# Patient Record
Sex: Male | Born: 1970 | Race: White | Hispanic: No | Marital: Married | Smoking: Never smoker
Health system: Southern US, Community
[De-identification: ages and names within clinical notes are randomized; demographics above are authoritative.]

## PROBLEM LIST (undated history)

## (undated) DIAGNOSIS — E559 Vitamin D deficiency, unspecified: Secondary | ICD-10-CM

## (undated) DIAGNOSIS — Z9989 Dependence on other enabling machines and devices: Secondary | ICD-10-CM

## (undated) DIAGNOSIS — M1990 Unspecified osteoarthritis, unspecified site: Secondary | ICD-10-CM

## (undated) DIAGNOSIS — R0600 Dyspnea, unspecified: Secondary | ICD-10-CM

## (undated) DIAGNOSIS — I10 Essential (primary) hypertension: Secondary | ICD-10-CM

## (undated) DIAGNOSIS — E785 Hyperlipidemia, unspecified: Secondary | ICD-10-CM

## (undated) DIAGNOSIS — G4733 Obstructive sleep apnea (adult) (pediatric): Secondary | ICD-10-CM

## (undated) DIAGNOSIS — G43909 Migraine, unspecified, not intractable, without status migrainosus: Secondary | ICD-10-CM

## (undated) DIAGNOSIS — I2510 Atherosclerotic heart disease of native coronary artery without angina pectoris: Secondary | ICD-10-CM

## (undated) HISTORY — DX: Atherosclerotic heart disease of native coronary artery without angina pectoris: I25.10

## (undated) HISTORY — DX: Morbid (severe) obesity due to excess calories: E66.01

## (undated) HISTORY — PX: LIPOMA EXCISION: SHX5283

## (undated) HISTORY — DX: Dependence on other enabling machines and devices: Z99.89

## (undated) HISTORY — DX: Vitamin D deficiency, unspecified: E55.9

## (undated) HISTORY — DX: Obstructive sleep apnea (adult) (pediatric): G47.33

## (undated) HISTORY — PX: HERNIA REPAIR: SHX51

## (undated) HISTORY — DX: Essential (primary) hypertension: I10

## (undated) HISTORY — DX: Hyperlipidemia, unspecified: E78.5

---

## 2004-07-15 ENCOUNTER — Ambulatory Visit: Payer: Self-pay | Admitting: Gastroenterology

## 2004-08-09 ENCOUNTER — Ambulatory Visit: Payer: Self-pay | Admitting: Gastroenterology

## 2007-01-11 HISTORY — PX: UMBILICAL HERNIA REPAIR: SHX196

## 2007-04-25 ENCOUNTER — Ambulatory Visit: Payer: Self-pay | Admitting: Gastroenterology

## 2007-04-27 ENCOUNTER — Ambulatory Visit (HOSPITAL_COMMUNITY): Admission: RE | Admit: 2007-04-27 | Discharge: 2007-04-27 | Payer: Self-pay | Admitting: Gastroenterology

## 2007-05-04 ENCOUNTER — Ambulatory Visit: Payer: Self-pay | Admitting: Gastroenterology

## 2007-05-04 DIAGNOSIS — K432 Incisional hernia without obstruction or gangrene: Secondary | ICD-10-CM | POA: Insufficient documentation

## 2007-06-12 ENCOUNTER — Encounter: Payer: Self-pay | Admitting: Gastroenterology

## 2007-10-03 ENCOUNTER — Ambulatory Visit (HOSPITAL_COMMUNITY): Admission: RE | Admit: 2007-10-03 | Discharge: 2007-10-03 | Payer: Self-pay | Admitting: Surgery

## 2007-10-16 ENCOUNTER — Encounter: Payer: Self-pay | Admitting: Gastroenterology

## 2010-05-25 NOTE — Op Note (Signed)
NAMESHAQUON, GROPP              ACCOUNT NO.:  [id number]   MEDICAL RECORD NO.:  [id number]          PATIENT TYPE:  AMB   LOCATION:  DAY                          FACILITY:  [facility]   PHYSICIAN:  Wilmon Arms. Corliss Skains, M.D. DATE OF BIRTH:  1970-09-14   DATE OF PROCEDURE:  10/03/2007  DATE OF DISCHARGE:                               OPERATIVE REPORT   PREOPERATIVE DIAGNOSIS:  Supraumbilical ventral hernia.   POSTOPERATIVE DIAGNOSIS:  Supraumbilical ventral hernia.   PROCEDURE PERFORMED:  Open ventral hernia repair with mesh.   SURGEON:  Wilmon Arms. Tsuei, M.D.   ANESTHESIA:  General.   INDICATIONS:  The patient is a 40 year old male who was morbidly obese  who presents with a 4 month history of palpable bulge in his abdomen  above the umbilicus.  He initially felt this when he was coughing.  His  gastroenterologist obtained a CT scan which showed a ventral hernia  above the umbilicus with a 2 cm defect containing similar herniated  omental fat.  No small bowel was noted in the hernia.  The patient  presents now for repair of his hernia.   DESCRIPTION OF PROCEDURE:  The patient was brought to the operating room  and placed in the supine position on the operating room table.  After an  adequate level of general anesthesia was obtained, his abdomen was  shaved, prepped with Betadine and draped in sterile fashion.  A time-out  was taken to assure the proper patient and proper procedure.  A vertical  incision was made above the umbilicus extending about 8 cm.  Dissection  was carried down to the subcutaneous tissues with the cautery.  We  encountered a large hernia sac.  There was some adherent fat.  We  carefully dissected the hernia sac away from the surrounding tissues all  the way down to the fascia.  The patient is morbidly obese.  There was a  lot of subcutaneous adipose tissue and we actually had to use a Balfour  retractor to provide exposure.  Once we were finally able to expose  the  fascia it became apparent that there were actually two hernia defects.  He has a very small defect at his umbilicus.  The large defect measures  about 3 cm and was located above the umbilicus.  We connected both of  these into one large defect.  We reduced all of the hernia sac into the  preperitoneal space.  I cleared the underside of the fascia in all  directions.  We used a large Ventralex mesh and inserted this into the  preperitoneal space.  This was secured to the undersurface of the fascia  with a total of 10 interrupted transfascial zero Prolene sutures.  The  fascia was then reapproximated anterior to the mesh with a running #1  Prolene suture.  The subcutaneous tissues were irrigated and infiltrated  with quarter percent Marcaine  with epinephrine.  It was closed with a deep layer of 3-0 Vicryl and a  subcuticular 4-0 Monocryl.  Steri-Strips and clean dressings were  applied.  The patient was then extubated and then  brought to recovery in  stable condition.  All sponge, instrument and needle counts were  correct.      Wilmon Arms. Tsuei, M.D.  Electronically Signed     MKT/MEDQ  D:  10/03/2007  T:  10/04/2007  Job:  [id number]

## 2010-10-11 LAB — CBC
MCV: 87.2
Platelets: 338
WBC: 7.3

## 2010-10-11 LAB — DIFFERENTIAL
Eosinophils Absolute: 0.3
Lymphs Abs: 1.6
Neutro Abs: 4.8
Neutrophils Relative %: 66

## 2010-10-11 LAB — BASIC METABOLIC PANEL
BUN: 14
Calcium: 9.4
Chloride: 107
Creatinine, Ser: 0.89
GFR calc non Af Amer: >60

## 2012-03-14 ENCOUNTER — Ambulatory Visit (HOSPITAL_BASED_OUTPATIENT_CLINIC_OR_DEPARTMENT_OTHER): Payer: BC Managed Care – PPO | Attending: Family Medicine | Admitting: Radiology

## 2012-03-14 VITALS — Ht 70.0 in | Wt 370.0 lb

## 2012-03-14 DIAGNOSIS — G4733 Obstructive sleep apnea (adult) (pediatric): Secondary | ICD-10-CM | POA: Insufficient documentation

## 2012-03-24 DIAGNOSIS — R0609 Other forms of dyspnea: Secondary | ICD-10-CM

## 2012-03-24 DIAGNOSIS — R0989 Other specified symptoms and signs involving the circulatory and respiratory systems: Secondary | ICD-10-CM

## 2012-03-24 DIAGNOSIS — G4733 Obstructive sleep apnea (adult) (pediatric): Secondary | ICD-10-CM

## 2012-03-24 NOTE — Procedures (Signed)
[name], Michael Harrison              ACCOUNT NO.:  [id number]  MEDICAL RECORD NO.:  [id number]          PATIENT TYPE:  OUT  LOCATION:  SLEEP CENTER                 FACILITY:  [facility]  PHYSICIAN:  Serigne Kubicek D. Maple Hudson, MD, FCCP, FACPDATE OF BIRTH:  [birth year] 01[birth year] 1971  DATE OF STUDY:  03/14/2012                           NOCTURNAL POLYSOMNOGRAM  REFERRING PHYSICIAN:  Francis P. Modesto Charon, M.D.  INDICATION FOR STUDY:  Insomnia with sleep apnea.  EPWORTH SLEEPINESS SCORE:  12/24, BMI 53.1, weight 370 pounds, height 70 inches, neck 18.5 inches.  MEDICATIONS:  Home medications are charted as "none."  SLEEP ARCHITECTURE:  Total sleep time 246.5 minutes with sleep efficiency 65%.  Stage I was 11.8%, stage II 74.4%, stage III 5.7%, REM 8.1% of total sleep time.  Sleep latency 24.5 minutes, REM latency 265 minutes.  Awake after sleep onset 109.5 minutes.  Arousal index 56.2. Bedtime medication:  None.  RESPIRATORY DATA:  Apnea-hypopnea index (AHI) 85.2 per hour.  A total of 350 events were scored, including 176 obstructive apneas, 15 central apneas, 25 mixed apneas, 134 hypopneas.  Most events were associated with nonsupine sleep position.  REM AHI 102 per hour.  Because of delayed sleep onset, there was insufficient early sleep to meet protocol requirements for application of split protocol CPAP titration.  OXYGEN DATA:  Moderately loud snoring with oxygen desaturation to a nadir of 79% and mean oxygen saturation through the study of 91.4% on room air.  CARDIAC DATA:  Normal sinus rhythm.  MOVEMENT-PARASOMNIA:  No significant movement disturbance.  Bathroom x1.  IMPRESSIONS-RECOMMENDATIONS: 1. Sleep architecture was marked by some difficulty initiating and     maintaining sleep separate from respiratory events.  Sleep onset     was at midnight.  He woke spontaneously shortly after 1 a.m. and     was awake until nearly 3:00 a.m.  It may help to utilize a sedative     hypnotic as he begins to try to  correct his sleep problems. 2. Severe obstructive sleep apnea/hypopnea syndrome, AHI 85.2 per hour     with mostly non supine and REM associated events.  Moderately loud     snoring with oxygen desaturation to a nadir of 79% and mean oxygen     saturation through the study of 91.4% on room air. 3. Because of delayed sleep onset, there was insufficient early sleep     to meet protocol requirements for use of     split protocol CPAP titration on this study.  Recommend return for     dedicated CPAP titration or early consideration of alternatives as     appropriate.     Nona Gracey D. Maple Hudson, MD, [facility], FACP Diplomate, American Board of Sleep Medicine    CDY/MEDQ  D:  03/24/2012 09:44:04  T:  03/24/2012 10:10:27  Job:  [id number]

## 2012-03-26 ENCOUNTER — Ambulatory Visit (HOSPITAL_BASED_OUTPATIENT_CLINIC_OR_DEPARTMENT_OTHER): Payer: BC Managed Care – PPO

## 2012-03-27 ENCOUNTER — Telehealth: Payer: Self-pay | Admitting: Family Medicine

## 2012-03-27 ENCOUNTER — Telehealth: Payer: Self-pay

## 2012-03-27 ENCOUNTER — Other Ambulatory Visit: Payer: Self-pay

## 2012-03-27 DIAGNOSIS — G4733 Obstructive sleep apnea (adult) (pediatric): Secondary | ICD-10-CM

## 2012-03-27 NOTE — Telephone Encounter (Signed)
Almira Coaster Results in computer under [facility]  Dr Modesto Charon needs to review and call patient with results

## 2012-03-27 NOTE — Telephone Encounter (Signed)
Patient has severe obstructive sleep apnea and requires CPAP titration study. Check with patient whether or not Dr. Maple Hudson had inform him of such need for further testing with CPAP titration study. If not please expedite CPAP titration study

## 2012-03-27 NOTE — Telephone Encounter (Signed)
     Patient has severe obstructive sleep apnea and requires CPAP titration study.  Check with patient whether or not Dr. Maple Hudson had inform him of such need for further testing with CPAP titration study.  If not please expedite CPAP titration study       Spoke with wife  Would like this week or the first week of April. Going out of town next week. Referral sent to [facility]

## 2012-03-27 NOTE — Telephone Encounter (Signed)
PTS WIFE CALLED AND WANTS RESULTS OF SLEEP STUDY DONE TWO WEEKS AGO

## 2012-03-31 ENCOUNTER — Ambulatory Visit (INDEPENDENT_AMBULATORY_CARE_PROVIDER_SITE_OTHER): Payer: BC Managed Care – PPO | Admitting: Family Medicine

## 2012-03-31 VITALS — BP 122/82 | HR 87 | Temp 98.3°F | Ht 70.0 in | Wt 379.0 lb

## 2012-03-31 DIAGNOSIS — S61409A Unspecified open wound of unspecified hand, initial encounter: Secondary | ICD-10-CM

## 2012-03-31 DIAGNOSIS — S61451A Open bite of right hand, initial encounter: Secondary | ICD-10-CM

## 2012-03-31 DIAGNOSIS — J45909 Unspecified asthma, uncomplicated: Secondary | ICD-10-CM

## 2012-03-31 DIAGNOSIS — S61452A Open bite of left hand, initial encounter: Secondary | ICD-10-CM

## 2012-03-31 DIAGNOSIS — T148 Other injury of unspecified body region: Secondary | ICD-10-CM

## 2012-03-31 MED ORDER — TETANUS-DIPHTH-ACELL PERTUSSIS 5-2.5-18.5 LF-MCG/0.5 IM SUSP
0.5000 mL | Freq: Once | INTRAMUSCULAR | Status: AC
  Administered 2012-03-31: 0.5 mL via INTRAMUSCULAR

## 2012-03-31 MED ORDER — ALBUTEROL SULFATE HFA 108 (90 BASE) MCG/ACT IN AERS: 2.0000 | INHALATION_SPRAY | Freq: Four times a day (QID) | RESPIRATORY_TRACT | Status: DC | PRN

## 2012-03-31 MED ORDER — AMOXICILLIN-POT CLAVULANATE 875-125 MG PO TABS: 1.0000 | ORAL_TABLET | Freq: Two times a day (BID) | ORAL | Status: DC

## 2012-03-31 NOTE — Progress Notes (Signed)
Subjective:     Patient ID: Michael Harrison, male   DOB: January 14, 1970, 42 y.o.   MRN: [id number]  HPI This patient's mother recently passed away. He obtained her cat and was going to give it to someone else to care for. When he picked up a cat the cat got scared and bit him on both hands and scratched him in several places. Supposed to have its shots but its in quarantine at present today. He came to the wound checked it just happened several hours ago. He does remember his last tetanus shot. Past Medical History  Diagnosis Date  . Hypertension   . Hyperlipidemia   . Vitamin D deficiency   . Morbid obesity    Past Surgical History  Procedure Laterality Date  . Hernia repair     History   Social History  . Marital Status: Married    Spouse Name: N/A    Number of Children: N/A  . Years of Education: N/A   Occupational History  . Not on file.   Social History Main Topics  . Smoking status: Never Smoker   . Smokeless tobacco: Not on file  . Alcohol Use: Not on file  . Drug Use: Not on file  . Sexually Active: Not on file   Other Topics Concern  . Not on file   Social History Narrative  . No narrative on file   Family History  Problem Relation Age of Onset  . Diabetes Mother   . Heart disease Mother    No current outpatient prescriptions on file prior to visit.   No current facility-administered medications on file prior to visit.   Allergies  Allergen Reactions  . Iohexol      Desc: Notes indicated PT needs 13 hr prep    Immunization History  Administered Date(s) Administered  . Tdap 03/31/2012   Prior to Admission medications   Medication Sig Start Date End Date Taking? Authorizing Provider  fluticasone (FLONASE) 50 MCG/ACT nasal spray Place 2 sprays into the nose daily.   Yes Historical Provider, MD  albuterol (PROVENTIL HFA;VENTOLIN HFA) 108 (90 BASE) MCG/ACT inhaler Inhale 2 puffs into the lungs every 6 (six) hours as needed for wheezing. 03/31/12   Ileana Ladd, MD  amoxicillin-clavulanate (AUGMENTIN) 875-125 MG per tablet Take 1 tablet by mouth 2 (two) times daily. 03/31/12   Ileana Ladd, MD    Review of Systems    he also has a upper respiratory tract infection and it has caused him to have a bit of a bronchitis and a flareup of his asthma he does need a refill of the albuterol inhaler otherwise is not having any other major problems Objective:   Physical Exam On examination he appeared in no acute distress. Obese Vital signs as documented. BP 122/82  Pulse 87  Temp(Src) 98.3 F (36.8 C) (Oral)  Ht 5\' 10"  (1.778 m)  Wt 379 lb (171.913 kg)  BMI 54.38 kg/m2  Skin warm and dry and without overt rashes. He has most multiple scratch marks on his wrists and hands and several tiny puncture marks in various areas of the of his hands and on the fat pad of the thumbs Head &Neck without JVD. Normal. Lungs he has scattered rhonchi and end expiratory wheezes is not in any distress  Heart  exam notable for regular rhythm, normal sounds and absence of murmurs, rubs or gallops.  Abdomen unremarkable and without evidence of organomegaly, masses, or abdominal aortic enlargement.  Extremities nonedematous.  Assessment:     Animal bite of hand, right, initial encounter - Plan: TDaP (BOOSTRIX) injection 0.5 mL, amoxicillin-clavulanate (AUGMENTIN) 875-125 MG per tablet  Animal bite of hand, left, initial encounter - Plan: TDaP (BOOSTRIX) injection 0.5 mL, amoxicillin-clavulanate (AUGMENTIN) 875-125 MG per tablet  Asthmatic bronchitis - Plan: albuterol (PROVENTIL HFA;VENTOLIN HFA) 108 (90 BASE) MCG/ACT inhaler      Plan:       Patient had his wounds cleaned with Betadine and peroxide and dressed here in the clinic. Was given a tetanus booster.  He was placed on antibiotics Augmentin 875 mg by mouth ordered in meds/ orders. Wound check in 3 days however patient is not in town he travels for work. If it gets worse he will proceed to urgent care  or emergency room to get evaluated. Return to clinic when necessary.Thelma Barge P. Modesto Charon, M.D.

## 2012-04-01 ENCOUNTER — Encounter (HOSPITAL_COMMUNITY): Payer: Self-pay | Admitting: *Deleted

## 2012-04-01 ENCOUNTER — Ambulatory Visit (INDEPENDENT_AMBULATORY_CARE_PROVIDER_SITE_OTHER): Payer: BC Managed Care – PPO | Admitting: Family Medicine

## 2012-04-01 ENCOUNTER — Emergency Department (HOSPITAL_COMMUNITY)
Admission: EM | Admit: 2012-04-01 | Discharge: 2012-04-01 | Disposition: A | Discharge status: Home / Self Care | Payer: BC Managed Care – PPO | Attending: Emergency Medicine | Admitting: Emergency Medicine

## 2012-04-01 ENCOUNTER — Emergency Department (HOSPITAL_COMMUNITY): Payer: BC Managed Care – PPO

## 2012-04-01 VITALS — BP 142/92 | HR 80 | Temp 97.9°F | Resp 20 | Ht 68.5 in | Wt 379.2 lb

## 2012-04-01 DIAGNOSIS — W5501XA Bitten by cat, initial encounter: Secondary | ICD-10-CM

## 2012-04-01 DIAGNOSIS — L0291 Cutaneous abscess, unspecified: Secondary | ICD-10-CM

## 2012-04-01 DIAGNOSIS — Z8639 Personal history of other endocrine, nutritional and metabolic disease: Secondary | ICD-10-CM | POA: Insufficient documentation

## 2012-04-01 DIAGNOSIS — S61209A Unspecified open wound of unspecified finger without damage to nail, initial encounter: Secondary | ICD-10-CM | POA: Insufficient documentation

## 2012-04-01 DIAGNOSIS — I10 Essential (primary) hypertension: Secondary | ICD-10-CM | POA: Insufficient documentation

## 2012-04-01 DIAGNOSIS — L089 Local infection of the skin and subcutaneous tissue, unspecified: Secondary | ICD-10-CM

## 2012-04-01 DIAGNOSIS — IMO0002 Reserved for concepts with insufficient information to code with codable children: Secondary | ICD-10-CM | POA: Insufficient documentation

## 2012-04-01 DIAGNOSIS — IMO0001 Reserved for inherently not codable concepts without codable children: Secondary | ICD-10-CM | POA: Insufficient documentation

## 2012-04-01 DIAGNOSIS — Z792 Long term (current) use of antibiotics: Secondary | ICD-10-CM | POA: Insufficient documentation

## 2012-04-01 DIAGNOSIS — T148XXA Other injury of unspecified body region, initial encounter: Secondary | ICD-10-CM

## 2012-04-01 DIAGNOSIS — Z79899 Other long term (current) drug therapy: Secondary | ICD-10-CM | POA: Insufficient documentation

## 2012-04-01 DIAGNOSIS — Y939 Activity, unspecified: Secondary | ICD-10-CM | POA: Insufficient documentation

## 2012-04-01 DIAGNOSIS — Y929 Unspecified place or not applicable: Secondary | ICD-10-CM | POA: Insufficient documentation

## 2012-04-01 DIAGNOSIS — L0390 Cellulitis, unspecified: Secondary | ICD-10-CM

## 2012-04-01 DIAGNOSIS — Z862 Personal history of diseases of the blood and blood-forming organs and certain disorders involving the immune mechanism: Secondary | ICD-10-CM | POA: Insufficient documentation

## 2012-04-01 LAB — CBC WITH DIFFERENTIAL/PLATELET
Eosinophils Relative: 9 % — ABNORMAL HIGH (ref 0–5)
Lymphocytes Relative: 15 % (ref 12–46)
Lymphs Abs: 1.3 10*3/uL (ref 0.7–4.0)
MCV: 84.2 fL (ref 78.0–100.0)
Neutrophils Relative %: 67 % (ref 43–77)
Platelets: 349 10*3/uL (ref 150–400)
RBC: 4.93 MIL/uL (ref 4.22–5.81)
WBC: 8.4 10*3/uL (ref 4.0–10.5)

## 2012-04-01 LAB — BASIC METABOLIC PANEL
BUN: 15 mg/dL (ref 6–23)
CO2: 24 mEq/L (ref 19–32)
Calcium: 8.8 mg/dL (ref 8.4–10.5)
Chloride: 102 mEq/L (ref 96–112)
Creatinine, Ser: 0.78 mg/dL (ref 0.50–1.35)
GFR calc Af Amer: >90 mL/min (ref >90)
GFR calc non Af Amer: >90 mL/min (ref >90)
Glucose, Bld: 88 mg/dL (ref 70–99)
Potassium: 4 mEq/L (ref 3.5–5.1)
Sodium: 137 mEq/L (ref 135–145)

## 2012-04-01 MED ORDER — ONDANSETRON HCL 4 MG/2ML IJ SOLN
4.0000 mg | Freq: Once | INTRAMUSCULAR | Status: DC
  Filled 2012-04-01: qty 2

## 2012-04-01 MED ORDER — CLINDAMYCIN PHOSPHATE 600 MG/50ML IV SOLN
600.0000 mg | Freq: Once | INTRAVENOUS | Status: AC
  Administered 2012-04-01: 600 mg via INTRAVENOUS
  Filled 2012-04-01: qty 50

## 2012-04-01 MED ORDER — CLINDAMYCIN HCL 150 MG PO CAPS: 300.0000 mg | ORAL_CAPSULE | Freq: Two times a day (BID) | ORAL | Status: DC

## 2012-04-01 NOTE — ED Provider Notes (Signed)
History    This chart was scribed for non-physician practitioner working with Richardean Canal, MD by Donne Anon, ED Scribe. This patient was seen in room WA19/WA19 and the patient's care was started at 1534.   CSN: [id number]  Arrival date & time 04/01/12  1514   First MD Initiated Contact with Patient 04/01/12 1534      Chief Complaint  Patient presents with  . Animal Bite     The history is provided by the patient. No language interpreter was used.   Michael Harrison is a 42 y.o. male who presents to the Emergency Department complaining of sudden onset, constant, gradually worsening left hand pain due to a cat bite which occurred yesterday morning. He states the cat was a stray and is currently in quarantine. He states he saw Dr. Broadus John yesterday, who placed him on Augumentin (he took 2 doses yesterday, 1 today) and cleaned it. He reports associated soreness to palpation in his arm. He denies soreness when he moves his joints, fever, vomiting, chills, or any other pain.  Past Medical History  Diagnosis Date  . Hypertension   . Hyperlipidemia   . Vitamin D deficiency   . Morbid obesity     Past Surgical History  Procedure Laterality Date  . Hernia repair      Family History  Problem Relation Age of Onset  . Diabetes Mother   . Heart disease Mother     History  Substance Use Topics  . Smoking status: Never Smoker   . Smokeless tobacco: Not on file  . Alcohol Use: Not on file      Review of Systems  Constitutional: Negative for fever and chills.  Gastrointestinal: Negative for vomiting.  Musculoskeletal: Negative for arthralgias.  Skin: Positive for wound.  All other systems reviewed and are negative.    Allergies  Iohexol  Home Medications   Current Outpatient Rx  Name  Route  Sig  Dispense  Refill  . albuterol (PROVENTIL HFA;VENTOLIN HFA) 108 (90 BASE) MCG/ACT inhaler   Inhalation   Inhale 2 puffs into the lungs every 6 (six) hours as needed for  wheezing.   1 Inhaler   0   . amoxicillin-clavulanate (AUGMENTIN) 875-125 MG per tablet   Oral   Take 1 tablet by mouth 2 (two) times daily. Started 3/22         . fluticasone (FLONASE) 50 MCG/ACT nasal spray   Nasal   Place 2 sprays into the nose daily.         . clindamycin (CLEOCIN) 150 MG capsule   Oral   Take 2 capsules (300 mg total) by mouth 2 (two) times daily.   28 capsule   0     Triage Vitals; BP 155/96  Pulse 71  Temp(Src) 97.6 F (36.4 C) (Oral)  Resp 20  SpO2 100%  Physical Exam  Nursing note and vitals reviewed. Constitutional: He is oriented to person, place, and time. He appears well-developed and well-nourished. No distress.  HENT:  Head: Normocephalic and atraumatic.  Eyes: EOM are normal.  Neck: Neck supple. No tracheal deviation present.  Cardiovascular: Normal rate.   Pulmonary/Chest: Effort normal. No respiratory distress.  Musculoskeletal: Normal range of motion.  Neurological: He is alert and oriented to person, place, and time.  Skin: Skin is warm and dry.  Multiple puncture wounds to right thumb with surrounding cellulitis that streaks up the anterior potion of the arm but stops before the elbow. Mild tenderness to palpation. No  decreased ROM. No pain with ROM. Skin is soft with no obvious abscess formation.  Psychiatric: He has a normal mood and affect. His behavior is normal.    ED Course  Procedures (including critical care time) DIAGNOSTIC STUDIES: Oxygen Saturation is 100% on room air, normal by my interpretation.    COORDINATION OF CARE: 3:45 PM Discussed treatment plan which includes at least 2 rounds of IV antibiotics with pt at bedside and pt agreed to plan.   4:06 PM Discussed case with Dr. Silverio Lay who thinks that 1 round of antibiotics will be sufficient.   Labs Reviewed  CBC WITH DIFFERENTIAL - Abnormal; Notable for the following:    Eosinophils Relative 9 (*)    All other components within normal limits  BASIC METABOLIC  PANEL   Dg Finger Thumb Left  04/01/2012  *RADIOLOGY REPORT*  Clinical Data: Animal bite, cat bite  RIGHT THUMB 2+V  Comparison: None.  Findings: There is no foreign body within the first digit.  No fracture.  No evidence of subcutaneous gas.  IMPRESSION: No fracture or foreign body.   Original Report Authenticated By: Genevive Bi, M.D.      1. Infected cat bite of finger, sequela       MDM  Pt has been advised of the symptoms that warrant their return to the ED. Patient has voiced understanding and has agreed to follow-up with the PCP or specialist.  I personally performed the services described in this documentation, which was scribed in my presence. The recorded information has been reviewed and is accurate.        Dorthula Matas, PA-C 04/01/12 2352

## 2012-04-01 NOTE — Progress Notes (Signed)
[facility]:  Office Visit  Chief Complaint:  Chief Complaint  Patient presents with  . Animal Bite    Follow up    HPI: Michael Harrison is a right handed  42 y.o. male who complains of rash from a cat bite which occurred yesterday, He was seen yesterday and was given Augmentin 875 mg and got his first dose in within 4 hrs. He was trying to take his mother's cat for adoption but the cat got spooked and bit him in  Left thumb in 2 areas, on the thumb and also proximal phalanx and also got scratched on the right wrist. Today after 3 doses of Augmentin he has a red streak that is running up his left thumb to his elbow. It is sore. He has some swelling and has some pain with ROM feels there is some popping. Denies fevers, chills. No diabetes that he knows of. He is also being treated for bronchitis with flonase and albuterol  inh.   The cat is not vaccinated, is being kept by veterinarian Dr. Althea Charon in [place]. They are quarantining him for the next 10 days. Animal control ? Notified.  Patient was given a tetanus injection yesterday.    Past Medical History  Diagnosis Date  . Hypertension   . Hyperlipidemia   . Vitamin D deficiency   . Morbid obesity    Past Surgical History  Procedure Laterality Date  . Hernia repair     History   Social History  . Marital Status: Married    Spouse Name: N/A    Number of Children: N/A  . Years of Education: N/A   Social History Main Topics  . Smoking status: Never Smoker   . Smokeless tobacco: None  . Alcohol Use: None  . Drug Use: None  . Sexually Active: None   Other Topics Concern  . None   Social History Narrative  . None   Family History  Problem Relation Age of Onset  . Diabetes Mother   . Heart disease Mother    Allergies  Allergen Reactions  . Iohexol      Desc: Notes indicated PT needs 13 hr prep    Prior to Admission medications   Medication Sig Start Date End Date Taking? Authorizing Provider   albuterol (PROVENTIL HFA;VENTOLIN HFA) 108 (90 BASE) MCG/ACT inhaler Inhale 2 puffs into the lungs every 6 (six) hours as needed for wheezing. 03/31/12  Yes Ileana Ladd, MD  amoxicillin-clavulanate (AUGMENTIN) 875-125 MG per tablet Take 1 tablet by mouth 2 (two) times daily. 03/31/12  Yes Ileana Ladd, MD  fluticasone (FLONASE) 50 MCG/ACT nasal spray Place 2 sprays into the nose daily.   Yes Historical Provider, MD     ROS: The patient denies fevers, chills, night sweats, unintentional weight loss, chest pain, palpitations, wheezing, dyspnea on exertion, nausea, vomiting, abdominal pain, dysuria, hematuria, melena, numbness, weakness, or tingling.   All other systems have been reviewed and were otherwise negative with the exception of those mentioned in the HPI and as above.    PHYSICAL EXAM: Filed Vitals:   04/01/12 1337  BP: 142/92  Pulse: 80  Temp: 97.9 F (36.6 C)  Resp: 20   Filed Vitals:   04/01/12 1337  Height: 5' 8.5" (1.74 m)  Weight: 379 lb 3.2 oz (172.004 kg)   Body mass index is 56.81 kg/(m^2).  General: Alert, no acute distress , obese white male HEENT:  Normocephalic, atraumatic, oropharynx patent.  Cardiovascular:  Regular rate and rhythm, no rubs murmurs or gallops.  No Carotid bruits, radial pulse intact. No pedal edema.  Respiratory: Clear to auscultation bilaterally.  No wheezes, rales, or rhonchi.  No cyanosis, no use of accessory musculature GI: No organomegaly, abdomen is soft and non-tender, positive bowel sounds.  No masses. Skin: + erythematous streak going from left thumb to elbow, + warmth and swelling at thumb Neurologic: Facial musculature symmetric. Psychiatric: Patient is appropriate throughout our interaction. Lymphatic: No cervical lymphadenopathy Musculoskeletal: Gait intact.   LABS: Results for orders placed during the hospital encounter of 10/03/07  BASIC METABOLIC PANEL      Result Value Range   Sodium 140     Potassium 4.0      Chloride 107     CO2 26     Glucose, Bld 95     BUN 14     Creatinine, Ser 0.89     Calcium 9.4     GFR calc non Af Amer >60     GFR calc Af Amer       Value: >60            The eGFR has been calculated     using the MDRD equation.     This calculation has not been     validated in all clinical  CBC      Result Value Range   WBC 7.3     RBC 4.94     Hemoglobin 14.6     HCT 43.1     MCV 87.2     MCHC 33.8     RDW 13.9     Platelets 338    DIFFERENTIAL      Result Value Range   Neutrophils Relative 66     Neutro Abs 4.8     Lymphocytes Relative 22     Lymphs Abs 1.6     Monocytes Relative 7     Monocytes Absolute 0.5     Eosinophils Relative 4     Eosinophils Absolute 0.3     Basophils Relative 2 (*)    Basophils Absolute 0.1       EKG/XRAY:   Primary read interpreted by Dr. Conley Rolls at [facility].   ASSESSMENT/PLAN: Encounter Diagnoses  Name Primary?  . Cellulitis Yes  . Cat bite, initial encounter    Failed outpatient abx Patient will go to [facility], charge nurse called He will go by personal car Patient agrees to plan after risks and benefits explaine    Bonni Neuser PHUONG, DO 04/01/2012 3:01 PM

## 2012-04-01 NOTE — ED Notes (Signed)
Pt reports he was bit by a stray cat on yesterday on his left hand. Pt has red streaking up left arm. Pt reports soreness to arm.

## 2012-04-01 NOTE — ED Provider Notes (Signed)
Medical screening examination/treatment/procedure(s) were performed by non-physician practitioner and as supervising physician I was immediately available for consultation/collaboration.   Richardean Canal, MD 04/01/12 [phone]

## 2012-04-10 ENCOUNTER — Ambulatory Visit (HOSPITAL_BASED_OUTPATIENT_CLINIC_OR_DEPARTMENT_OTHER): Payer: BC Managed Care – PPO | Attending: Family Medicine | Admitting: Radiology

## 2012-04-10 VITALS — Ht 70.0 in | Wt 370.0 lb

## 2012-04-10 DIAGNOSIS — G4710 Hypersomnia, unspecified: Secondary | ICD-10-CM | POA: Insufficient documentation

## 2012-04-10 DIAGNOSIS — Z9989 Dependence on other enabling machines and devices: Secondary | ICD-10-CM

## 2012-04-10 DIAGNOSIS — G4733 Obstructive sleep apnea (adult) (pediatric): Secondary | ICD-10-CM

## 2012-04-11 ENCOUNTER — Other Ambulatory Visit: Payer: Self-pay | Admitting: *Deleted

## 2012-04-11 MED ORDER — FLUTICASONE PROPIONATE 50 MCG/ACT NA SUSP: 2.0000 | Freq: Every day | NASAL | Status: DC

## 2012-04-14 DIAGNOSIS — R0989 Other specified symptoms and signs involving the circulatory and respiratory systems: Secondary | ICD-10-CM

## 2012-04-14 DIAGNOSIS — G4730 Sleep apnea, unspecified: Secondary | ICD-10-CM

## 2012-04-14 DIAGNOSIS — R0609 Other forms of dyspnea: Secondary | ICD-10-CM

## 2012-04-14 DIAGNOSIS — G4710 Hypersomnia, unspecified: Secondary | ICD-10-CM

## 2012-04-14 NOTE — Procedures (Signed)
NAME:  Michael Harrison, Michael Harrison              ACCOUNT NO.:  [id number]  MEDICAL RECORD NO.:  [id number]          PATIENT TYPE:  OUT  LOCATION:  SLEEP CENTER                 FACILITY:  [facility]  PHYSICIAN:  Pearline Yerby D. Maple Hudson, MD, FCCP, FACPDATE OF BIRTH:  1970-11-05  DATE OF STUDY:  04/10/2012                           NOCTURNAL POLYSOMNOGRAM  REFERRING PHYSICIAN:  Francis P. Modesto Charon, M.D.  INDICATION FOR STUDY:  Hypersomnia with sleep apnea.  EPWORTH SLEEPINESS SCORE:  12/24, BMI 53.1, weight 370 pounds, height 70 inches, neck 18.5 inches.  MEDICATIONS:  Home medications charted as "none."  A baseline diagnostic NPSG on March 14, 2012, recorded AHI 85.2 per hour.  Weight was recorded at 370 pounds.  CPAP titration is now requested.  SLEEP ARCHITECTURE:  Total sleep time 329 minutes with sleep efficiency 80.2%.  Stage I was 12.2%, stage II 61.9%, stage III 2.1%, REM 23.9% of total sleep time.  Sleep latency 20.5 minutes, REM latency 48.5 minutes, awake after sleep onset 62.5 minutes.  Arousal index 22.4.  BEDTIME MEDICATION:  None.  RESPIRATORY DATA:  CPAP titration protocol.  CPAP was titrated to 15 CWP with residual AHI 8 per hour.  There were persistent central apneas and hypopneas.  The technician then changed to bilevel (BiPAP) and titrated to a final pressure inspiratory 19 and expiratory 14 CWP.  There were still some residual central events and moderate snoring.  He was switched to a lower pressure inspiratory 17 and expiratory 13 for comfort and had best scores at that setting.  He wore a Psychologist, forensic mask with heated humidifier.  OXYGEN DATA:  Snoring was minimal at final BiPAP pressure with mean oxygen saturation through the study of 93.3% on room air.  CARDIAC DATA:  Normal sinus rhythm.  MOVEMENT-PARASOMNIA:  No significant movement disturbance.  No bathroom trips.  IMPRESSIONS-RECOMMENDATIONS: 1. CPAP provided inadequate control at 15 CWP.  Best final control  was     obtained with BiPAP inspiratory 17 and expiratory 13 CWP, AHI 1.2     per hour.  He wore a Psychologist, forensic mask with     heated humidifier.  Snoring was markedly improved and mean oxygen     saturation held 93.3% on room air. 2. Baseline diagnostic NPSG on March 14, 2012, recorded AHI 85.2 per     hour.  Body weight for the initial study was 370 pounds.     Ferdinando Lodge D. Maple Hudson, MD, [facility], FACP Diplomate, American Board of Sleep Medicine    CDY/MEDQ  D:  04/14/2012 15:18:19  T:  04/14/2012 09:81:19  Job:  [id number]

## 2012-04-16 ENCOUNTER — Encounter: Payer: Self-pay | Admitting: Family Medicine

## 2012-04-16 ENCOUNTER — Ambulatory Visit (INDEPENDENT_AMBULATORY_CARE_PROVIDER_SITE_OTHER): Payer: BC Managed Care – PPO | Admitting: Family Medicine

## 2012-04-16 VITALS — BP 137/90 | HR 68 | Temp 97.1°F | Ht 70.0 in | Wt 390.4 lb

## 2012-04-16 DIAGNOSIS — E559 Vitamin D deficiency, unspecified: Secondary | ICD-10-CM | POA: Insufficient documentation

## 2012-04-16 DIAGNOSIS — I10 Essential (primary) hypertension: Secondary | ICD-10-CM | POA: Insufficient documentation

## 2012-04-16 DIAGNOSIS — G4733 Obstructive sleep apnea (adult) (pediatric): Secondary | ICD-10-CM | POA: Insufficient documentation

## 2012-04-16 DIAGNOSIS — E785 Hyperlipidemia, unspecified: Secondary | ICD-10-CM | POA: Insufficient documentation

## 2012-04-16 DIAGNOSIS — F4320 Adjustment disorder, unspecified: Secondary | ICD-10-CM | POA: Insufficient documentation

## 2012-04-16 MED ORDER — VITAMIN D3 50 MCG (2000 UT) PO TABS: 2000.0000 [IU] | ORAL_TABLET | Freq: Every day | ORAL | Status: DC

## 2012-04-16 MED ORDER — ATORVASTATIN CALCIUM 20 MG PO TABS: 20.0000 mg | ORAL_TABLET | Freq: Every day | ORAL | Status: DC

## 2012-04-16 NOTE — Progress Notes (Signed)
Patient ID: Michael Harrison, male   DOB: 1970-05-19, 42 y.o.   MRN: [id number] SUBJECTIVE:  HPI: Patient is here for follow up of hyperlipidemia & Hpertension: deniesHeadache;deniesChest Pain;deniesweakness;deniesShortness of Breath and orthopnea;deniesVisual changes;deniespalpitations;deniescough;deniespedal edema;deniessymptoms of TIA or stroke;deniesClaudication symptoms. admits toCompliance with medications; deniesProblems with medications.   OSA: had sleep study and came to get Rx for his OSA.  Vitamin D def: Patient did not start on Vit D supplementation  Morbid obesity: has had a difficult time with mom dying and another family member passing as well.    PMH/PSH: reviewed/updated in Epic  SH/FH: reviewed/updated in Epic  Allergies: reviewed/updated in Epic  Medications: reviewed/updated in Epic  Immunizations: reviewed/updated in Epic   ROS: As above in the HPI. All other systems are stable or negative.   OBJECTIVE: APPEARANCE:  Patient in no acute distress.The patient appeared well nourished and normally developed. Acyanotic.  Waist:morbidly obese  VITAL SIGNS:BP 137/90  Pulse 68  Temp(Src) 97.1 F (36.2 C) (Oral)  Ht 5\' 10"  (1.778 m)  Wt 390 lb 6.4 oz (177.084 kg)  BMI 56.02 kg/m2   SKIN: warm and  Dry without overt rashes, tattoos and scars. Has the cholesterol plaques on his upeer eyelids  HEAD and Neck: without JVD, Normal No scleral icterus  CHEST & LUNGS: Clear  CVS: Reveals the PMI to be normally located. Regular rhythm, First and Second Heart sounds are normal, and absence of murmurs, rubs or gallops.  ABDOMEN:  Benign,, no organomegaly, no masses, no Abdominal Aortic enlargement. No Guarding , no rebound. No Bruits.  RECTAL:n/a  GU:n/a  EXTREMETIES: nonedematous. Both Femoral and Pedal pulses are normal.  MUSCULOSKELETAL:  Spine: normal Joints: intact  NEUROLOGIC: oriented to time,place and person; nonfocal. Strength is  normal Sensory is normal Reflexes are normal Cranial Nerves are normal.    ASSESSMENT: HTN (hypertension)  HLD (hyperlipidemia)  Severe obesity (BMI >= 40)  Unspecified vitamin D deficiency  Obstructive sleep apnea - Plan: For home use only DME Bipap  Adjustment reaction  counselled on his health status. His major problem being his morbid obesity. And the impact on his health. Need for compliance with recommendations. Reviewed his labs with patient.  PLAN: Orders Placed This Encounter  Procedures  . For home use only DME Bipap    Inspiratory 17 cwp, and expiratory 13 cwp.    Order Specific Question:  Inspiratory pressure    Answer:  OTHER SEE COMMENTS    Order Specific Question:  Expiratory pressure    Answer:  OTHER SEE COMMENTS   No results found for this or any previous visit (from the past 24 hour(s)).  Meds ordered this encounter  Medications  . atorvastatin (LIPITOR) 20 MG tablet    Sig: Take 1 tablet (20 mg total) by mouth daily.    Dispense:  90 tablet    Refill:  1  . Cholecalciferol (VITAMIN D3) 2000 UNITS TABS    Sig: Take 2,000 Int'l Units by mouth daily.    Dispense:  30 tablet    Refill:  11    Diet and Exercise discussed with patient. For nutrition information, I recommend books: Eat to Live by Dr Monico Hoar. Prevent and Reverse Heart Disease by Dr Suzzette Righter.  Exercise recommendations are:  If unable to walk, then the patient can exercise in a chair 3 times a day. By flapping arms like a bird gently and raising legs outwards to the front.  If ambulatory, the patient can go for walks for 30 minutes  3 times a week. Then increase the intensity and duration as tolerated. Goal is to try to attain exercise frequency to 5 times a week. Best to perform resistance exercises 2 days a week and cardio type exercises 3 days per week.  RTC x 2 months to recheck progress on weight loss, Bipap, exercise and grieving. Supportive therapy. Also to  recheck Vit D level in 2 months.  Marlet Korte P. Modesto Charon, M.D.

## 2012-04-16 NOTE — Patient Instructions (Signed)
Diet and Exercise discussed with patient. For nutrition information, I recommend books: Eat to Live by Dr Joel Fuhrman. Prevent and Reverse Heart Disease by Dr Caldwell Esselstyn.  Exercise recommendations are:  If unable to walk, then the patient can exercise in a chair 3 times a day. By flapping arms like a bird gently and raising legs outwards to the front.  If ambulatory, the patient can go for walks for 30 minutes 3 times a week. Then increase the intensity and duration as tolerated. Goal is to try to attain exercise frequency to 5 times a week. Best to perform resistance exercises 2 days a week and cardio type exercises 3 days per week.  

## 2012-04-19 ENCOUNTER — Telehealth: Payer: Self-pay | Admitting: Family Medicine

## 2012-04-19 NOTE — Telephone Encounter (Signed)
Sleep study sent to Dr. Modesto Charon for bipap order

## 2012-04-19 NOTE — Telephone Encounter (Signed)
BIPAP ORDER SENT TO [facility]

## 2012-05-02 ENCOUNTER — Telehealth: Payer: Self-pay | Admitting: Family Medicine

## 2012-05-02 NOTE — Telephone Encounter (Signed)
[facility] called patient and said that insurance denied bipap machine. He states that Dr Modesto Charon says he needs the bipap instead of the cpap and wants to know what he needs to do now in order to get one.

## 2012-05-04 NOTE — Telephone Encounter (Signed)
Dr Modesto Charon signed for CPAP and sent it to [facility]. i left patient a message

## 2012-06-30 ENCOUNTER — Ambulatory Visit (INDEPENDENT_AMBULATORY_CARE_PROVIDER_SITE_OTHER): Payer: BC Managed Care – PPO | Admitting: Nurse Practitioner

## 2012-06-30 VITALS — BP 124/75 | HR 80 | Temp 97.9°F | Wt 373.0 lb

## 2012-06-30 DIAGNOSIS — B9689 Other specified bacterial agents as the cause of diseases classified elsewhere: Secondary | ICD-10-CM

## 2012-06-30 DIAGNOSIS — J019 Acute sinusitis, unspecified: Secondary | ICD-10-CM

## 2012-06-30 MED ORDER — HYDROCODONE-HOMATROPINE 5-1.5 MG/5ML PO SYRP: 5.0000 mL | ORAL_SOLUTION | Freq: Three times a day (TID) | ORAL | Status: DC | PRN

## 2012-06-30 MED ORDER — AZITHROMYCIN 250 MG PO TABS: ORAL_TABLET | ORAL | Status: DC

## 2012-06-30 NOTE — Patient Instructions (Signed)

## 2012-06-30 NOTE — Progress Notes (Signed)
  Subjective:    Patient ID: Waddell Iten, male    DOB: 1970-07-19, 42 y.o.   MRN: [id number]  HPI   Patient in C/O cough ans congestion- started greater than one week ago- Can't sleep due to cough. OTC cough meds no help.    Review of Systems  Constitutional: Positive for fever (low grade).  HENT: Positive for ear pain, congestion, rhinorrhea, voice change (hoarse) and sinus pressure.   Respiratory: Negative.   Cardiovascular: Negative.   Gastrointestinal: Negative.        Objective:   Physical Exam  Constitutional: He is oriented to person, place, and time. He appears well-developed and well-nourished.  HENT:  Right Ear: Hearing, tympanic membrane, external ear and ear canal normal.  Left Ear: Hearing, tympanic membrane, external ear and ear canal normal.  Nose: Mucosal edema and rhinorrhea present. Right sinus exhibits maxillary sinus tenderness. Right sinus exhibits no frontal sinus tenderness. Left sinus exhibits maxillary sinus tenderness. Left sinus exhibits no frontal sinus tenderness.  Mouth/Throat: Posterior oropharyngeal erythema present.  Eyes: Pupils are equal, round, and reactive to light.  Neck: Normal range of motion. Neck supple.  Cardiovascular: Normal rate, regular rhythm and normal heart sounds.   Pulmonary/Chest: Effort normal and breath sounds normal.  Lymphadenopathy:    He has no cervical adenopathy.  Neurological: He is alert and oriented to person, place, and time.  Skin: Skin is warm.  Psychiatric: He has a normal mood and affect. His behavior is normal. Judgment and thought content normal.    BP 124/75  Pulse 80  Temp(Src) 97.9 F (36.6 C) (Oral)  Wt 373 lb (169.192 kg)  BMI 53.52 kg/m2       Assessment & Plan:  1. Acute bacterial rhinosinusitis 1. Take meds as prescribed 2. Use a cool mist humidifier especially during the winter months and when heat has  been humid. 3. Use saline nose sprays frequently 4. Saline irrigations of the nose  can be very helpful if done frequently.  * 4X daily for 1 week*  * Use of a nettie pot can be helpful with this. Follow directions with this* 5. Drink plenty of fluids 6. Keep thermostat turn down low 7.For any cough or congestion  Use plain Mucinex- regular strength or max strength is fine   * Children- consult with Pharmacist for dosing 8. For fever or aces or pains- take tylenol or ibuprofen appropriate for age and weight.  * for fevers greater than 101 orally you may alternate ibuprofen and tylenol every  3 hours.   - azithromycin (ZITHROMAX Z-PAK) 250 MG tablet; As directed  Dispense: 6 each; Refill: 0 - HYDROcodone-homatropine (HYCODAN) 5-1.5 MG/5ML syrup; Take 5 mLs by mouth every 8 (eight) hours as needed for cough.  Dispense: 120 mL; Refill: 0  Mary-Margaret Daphine Deutscher, FNP

## 2012-07-26 ENCOUNTER — Encounter: Payer: Self-pay | Admitting: Family Medicine

## 2012-07-26 ENCOUNTER — Ambulatory Visit (INDEPENDENT_AMBULATORY_CARE_PROVIDER_SITE_OTHER): Payer: BC Managed Care – PPO | Admitting: Family Medicine

## 2012-07-26 DIAGNOSIS — I10 Essential (primary) hypertension: Secondary | ICD-10-CM

## 2012-07-26 DIAGNOSIS — G4733 Obstructive sleep apnea (adult) (pediatric): Secondary | ICD-10-CM

## 2012-07-26 DIAGNOSIS — E785 Hyperlipidemia, unspecified: Secondary | ICD-10-CM

## 2012-07-26 DIAGNOSIS — J329 Chronic sinusitis, unspecified: Secondary | ICD-10-CM

## 2012-07-26 DIAGNOSIS — R635 Abnormal weight gain: Secondary | ICD-10-CM

## 2012-07-26 DIAGNOSIS — F4320 Adjustment disorder, unspecified: Secondary | ICD-10-CM

## 2012-07-26 DIAGNOSIS — E559 Vitamin D deficiency, unspecified: Secondary | ICD-10-CM

## 2012-07-26 MED ORDER — ASPIRIN EC 81 MG PO TBEC: 81.0000 mg | DELAYED_RELEASE_TABLET | Freq: Every day | ORAL | Status: DC

## 2012-07-26 MED ORDER — CEFDINIR 300 MG PO CAPS: 300.0000 mg | ORAL_CAPSULE | Freq: Two times a day (BID) | ORAL | Status: DC

## 2012-07-26 NOTE — Progress Notes (Signed)
Patient ID: Michael Harrison, male   DOB: 01/18/1970, 42 y.o.   MRN: [id number] SUBJECTIVE: CC: Chief Complaint  Patient presents with  . Follow-up    headache today sinus issues concerned about being on atorvastatian     HPI: Was at the [facility] for vacation and gained weight. Bought the book Patient is here for follow up of hyperlipidemia/htn/obesity Has a  Headache from sinus congestion and pain. ;denies Chest Pain;denies weakness;denies Shortness of Breath and orthopnea;denies Visual changes;denies palpitations;denies cough;denies pedal edema;denies symptoms of TIA or stroke;deniesClaudication symptoms. admits to Compliance with medications; denies Problems with medications.  Past Medical History  Diagnosis Date  . Hypertension   . Hyperlipidemia   . Vitamin D deficiency   . Morbid obesity   . OSA on CPAP    Past Surgical History  Procedure Laterality Date  . Hernia repair      umbilical hernia   . Lipoma excision      chest area   History   Social History  . Marital Status: Married    Spouse Name: N/A    Number of Children: N/A  . Years of Education: N/A   Occupational History  . Not on file.   Social History Main Topics  . Smoking status: Never Smoker   . Smokeless tobacco: Not on file  . Alcohol Use: No  . Drug Use: No  . Sexually Active: Not on file   Other Topics Concern  . Not on file   Social History Narrative  . No narrative on file   Family History  Problem Relation Age of Onset  . Diabetes Mother   . Heart disease Mother    Current Outpatient Prescriptions on File Prior to Visit  Medication Sig Dispense Refill  . albuterol (PROVENTIL HFA;VENTOLIN HFA) 108 (90 BASE) MCG/ACT inhaler Inhale 2 puffs into the lungs every 6 (six) hours as needed for wheezing.  1 Inhaler  0  . atorvastatin (LIPITOR) 20 MG tablet Take 1 tablet (20 mg total) by mouth daily.  90 tablet  1  . Cholecalciferol (VITAMIN D3) 2000 UNITS TABS Take 2,000 Int'l Units by mouth  daily.  30 tablet  11  . fluticasone (FLONASE) 50 MCG/ACT nasal spray Place 2 sprays into the nose daily.  16 g  2   No current facility-administered medications on file prior to visit.   Allergies  Allergen Reactions  . Iohexol      Desc: Notes indicated PT needs 13 hr prep    Immunization History  Administered Date(s) Administered  . Tdap 03/31/2012   Prior to Admission medications   Medication Sig Start Date End Date Taking? Authorizing Provider  albuterol (PROVENTIL HFA;VENTOLIN HFA) 108 (90 BASE) MCG/ACT inhaler Inhale 2 puffs into the lungs every 6 (six) hours as needed for wheezing. 03/31/12  Yes Ileana Ladd, MD  atorvastatin (LIPITOR) 20 MG tablet Take 1 tablet (20 mg total) by mouth daily. 04/16/12  Yes Ileana Ladd, MD  Cholecalciferol (VITAMIN D3) 2000 UNITS TABS Take 2,000 Int'l Units by mouth daily. 04/16/12  Yes Ileana Ladd, MD  fluticasone (FLONASE) 50 MCG/ACT nasal spray Place 2 sprays into the nose daily. 04/11/12  Yes Ileana Ladd, MD  aspirin EC 81 MG tablet Take 1 tablet (81 mg total) by mouth daily. 07/26/12   Ileana Ladd, MD  cefdinir (OMNICEF) 300 MG capsule Take 1 capsule (300 mg total) by mouth 2 (two) times daily. 07/26/12   Ileana Ladd, MD  ROS: As above in the HPI. All other systems are stable or negative.  OBJECTIVE: APPEARANCE:  Patient in no acute distress.The patient appeared well nourished and normally developed. Acyanotic. Waist: VITAL SIGNS:BP 136/92  Pulse 84  Temp(Src) 97.2 F (36.2 C) (Oral)  Wt 376 lb 9.6 oz (170.825 kg)  BMI 54.04 kg/m2  Morbidly Obese WM  SKIN: warm and  Dry without overt rashes, tattoos and scars. Xanthelasma of the eyelids.  HEAD and Neck: without JVD, Head and scalp: normal Eyes:No scleral icterus. Fundi normal, eye movements normal. Ears: Auricle normal, canal normal, Tympanic membranes normal, insufflation normal. Nose: nasal mucosal swelling and bogginess. Rhinitis. Tenderness  paranasally Throat: normal Neck & thyroid: normal  CHEST & LUNGS: Chest wall: normal Lungs: Clear  CVS: Reveals the PMI to be normally located. Regular rhythm, First and Second Heart sounds are normal,  absence of murmurs, rubs or gallops. Peripheral vasculature: Radial pulses: normal Dorsal pedis pulses: normal Posterior pulses: normal  ABDOMEN:  Appearance: morbidly obese Benign, no organomegaly, no masses, no Abdominal Aortic enlargement. No Guarding , no rebound. No Bruits. Bowel sounds: normal  RECTAL: N/A GU: N/A  EXTREMETIES: nonedematous. Both Femoral and Pedal pulses are normal.  MUSCULOSKELETAL:  Spine: normal Joints: intact  NEUROLOGIC: oriented to time,place and person; nonfocal. Strength is normal Sensory is normal Reflexes are normal Cranial Nerves are normal.  ASSESSMENT: Severe obesity (BMI >= 40)  Obstructive sleep apnea  HTN (hypertension) - Plan: COMPLETE METABOLIC PANEL WITH GFR  Unspecified vitamin D deficiency - Plan: Vitamin D 25 hydroxy  HLD (hyperlipidemia) - Plan: COMPLETE METABOLIC PANEL WITH GFR, NMR Lipoprofile with Lipids, aspirin EC 81 MG tablet  Adjustment reaction  Sinusitis nasal - Plan: cefdinir (OMNICEF) 300 MG capsule  PLAN: Orders Placed This Encounter  Procedures  . COMPLETE METABOLIC PANEL WITH GFR  . NMR Lipoprofile with Lipids  . Vitamin D 25 hydroxy   Meds ordered this encounter  Medications  . cefdinir (OMNICEF) 300 MG capsule    Sig: Take 1 capsule (300 mg total) by mouth 2 (two) times daily.    Dispense:  20 capsule    Refill:  0  . aspirin EC 81 MG tablet    Sig: Take 1 tablet (81 mg total) by mouth daily.    Dispense:  30 tablet    Refill:  11        Dr Woodroe Mode Recommendations  Diet and Exercise discussed with patient.  For nutrition information, I recommend books:  1).Eat to Live by Dr Monico Hoar. 2).Prevent and Reverse Heart Disease by Dr Suzzette Righter. 3) Dr Katherina Right  Book:  Program to Reverse Diabetes  Exercise recommendations are:  If unable to walk, then the patient can exercise in a chair 3 times a day. By flapping arms like a bird gently and raising legs outwards to the front.  If ambulatory, the patient can go for walks for 30 minutes 3 times a week. Then increase the intensity and duration as tolerated.  Goal is to try to attain exercise frequency to 5 times a week.  If applicable: Best to perform resistance exercises (machines or weights) 2 days a week and cardio type exercises 3 days per week.  Stressed the importance of Lifestyle changes weight loss and his health risks.  Return in about 3 months (around 10/26/2012) for Recheck medical problems.  Sharis Keeran P. Modesto Charon, M.D.

## 2012-07-26 NOTE — Patient Instructions (Addendum)
      Dr Glendi Mohiuddin's Recommendations  Diet and Exercise discussed with patient.  For nutrition information, I recommend books:  1).Eat to Live by Dr Joel Fuhrman. 2).Prevent and Reverse Heart Disease by Dr Caldwell Esselstyn. 3) Dr Neal Barnard's Book:  Program to Reverse Diabetes  Exercise recommendations are:  If unable to walk, then the patient can exercise in a chair 3 times a day. By flapping arms like a bird gently and raising legs outwards to the front.  If ambulatory, the patient can go for walks for 30 minutes 3 times a week. Then increase the intensity and duration as tolerated.  Goal is to try to attain exercise frequency to 5 times a week.  If applicable: Best to perform resistance exercises (machines or weights) 2 days a week and cardio type exercises 3 days per week.  

## 2012-07-27 LAB — COMPLETE METABOLIC PANEL WITH GFR
ALT: 21 U/L (ref 0–53)
AST: 18 U/L (ref 0–37)
Albumin: 4.1 g/dL (ref 3.5–5.2)
Alkaline Phosphatase: 111 U/L (ref 39–117)
BUN: 16 mg/dL (ref 6–23)
CO2: 26 mEq/L (ref 19–32)
Calcium: 9.6 mg/dL (ref 8.4–10.5)
Chloride: 102 mEq/L (ref 96–112)
Creat: 0.91 mg/dL (ref 0.50–1.35)
GFR, Est African American: >89 mL/min
GFR, Est Non African American: >89 mL/min
Glucose, Bld: 78 mg/dL (ref 70–99)
Potassium: 4.2 mEq/L (ref 3.5–5.3)
Sodium: 141 mEq/L (ref 135–145)
Total Bilirubin: 0.5 mg/dL (ref 0.3–1.2)
Total Protein: 6.8 g/dL (ref 6.0–8.3)

## 2012-07-27 LAB — NMR LIPOPROFILE WITH LIPIDS
Cholesterol, Total: 165 mg/dL (ref <200)
HDL Particle Number: 29.8 umol/L — ABNORMAL LOW (ref >=30.5)
HDL Size: 8.5 nm — ABNORMAL LOW (ref >=9.2)
HDL-C: 37 mg/dL — ABNORMAL LOW (ref >=40)
LDL (calc): 108 mg/dL — ABNORMAL HIGH (ref <100)
LDL Particle Number: 1335 nmol/L — ABNORMAL HIGH (ref <1000)
LDL Size: 20.2 nm — ABNORMAL LOW (ref >20.5)
LP-IR Score: 52 — ABNORMAL HIGH (ref <=45)
Large HDL-P: 1.6 umol/L — ABNORMAL LOW (ref >=4.8)
Large VLDL-P: 1.2 nmol/L (ref <=2.7)
Small LDL Particle Number: 796 nmol/L — ABNORMAL HIGH (ref <=527)
Triglycerides: 99 mg/dL (ref <150)
VLDL Size: 42 nm (ref <=46.6)

## 2012-07-27 LAB — VITAMIN D 25 HYDROXY (VIT D DEFICIENCY, FRACTURES): Vit D, 25-Hydroxy: 48 ng/mL (ref 30–89)

## 2012-07-28 ENCOUNTER — Other Ambulatory Visit: Payer: Self-pay | Admitting: Family Medicine

## 2012-07-28 DIAGNOSIS — E785 Hyperlipidemia, unspecified: Secondary | ICD-10-CM

## 2012-07-28 MED ORDER — ATORVASTATIN CALCIUM 40 MG PO TABS: 40.0000 mg | ORAL_TABLET | Freq: Every day | ORAL | Status: DC

## 2012-08-13 ENCOUNTER — Encounter: Payer: Self-pay | Admitting: *Deleted

## 2012-08-28 ENCOUNTER — Encounter: Payer: Self-pay | Admitting: Family Medicine

## 2012-08-28 ENCOUNTER — Ambulatory Visit (INDEPENDENT_AMBULATORY_CARE_PROVIDER_SITE_OTHER): Payer: BC Managed Care – PPO | Admitting: Family Medicine

## 2012-08-28 VITALS — BP 135/91 | HR 72 | Temp 97.9°F | Wt 376.8 lb

## 2012-08-28 DIAGNOSIS — M549 Dorsalgia, unspecified: Secondary | ICD-10-CM

## 2012-08-28 MED ORDER — IBUPROFEN 600 MG PO TABS: 600.0000 mg | ORAL_TABLET | Freq: Three times a day (TID) | ORAL | Status: DC | PRN

## 2012-08-28 MED ORDER — CYCLOBENZAPRINE HCL 10 MG PO TABS: 10.0000 mg | ORAL_TABLET | Freq: Three times a day (TID) | ORAL | Status: DC | PRN

## 2012-08-28 NOTE — Patient Instructions (Addendum)
Back Pain, Adult  Low back pain is very common. About 1 in 5 people have back pain. The cause of low back pain is rarely dangerous. The pain often gets better over time. About half of people with a sudden onset of back pain feel better in just 2 weeks. About 8 in 10 people feel better by 6 weeks.   CAUSES  Some common causes of back pain include:  · Strain of the muscles or ligaments supporting the spine.  · Wear and tear (degeneration) of the spinal discs.  · Arthritis.  · Direct injury to the back.  DIAGNOSIS  Most of the time, the direct cause of low back pain is not known. However, back pain can be treated effectively even when the exact cause of the pain is unknown. Answering your caregiver's questions about your overall health and symptoms is one of the most accurate ways to make sure the cause of your pain is not dangerous. If your caregiver needs more information, he or she may order lab work or imaging tests (X-rays or MRIs). However, even if imaging tests show changes in your back, this usually does not require surgery.  HOME CARE INSTRUCTIONS  For many people, back pain returns. Since low back pain is rarely dangerous, it is often a condition that people can learn to manage on their own.   · Remain active. It is stressful on the back to sit or stand in one place. Do not sit, drive, or stand in one place for more than 30 minutes at a time. Take short walks on level surfaces as soon as pain allows. Try to increase the length of time you walk each day.  · Do not stay in bed. Resting more than 1 or 2 days can delay your recovery.  · Do not avoid exercise or work. Your body is made to move. It is not dangerous to be active, even though your back may hurt. Your back will likely heal faster if you return to being active before your pain is gone.  · Pay attention to your body when you  bend and lift. Many people have less discomfort when lifting if they bend their knees, keep the load close to their bodies, and  avoid twisting. Often, the most comfortable positions are those that put less stress on your recovering back.  · Find a comfortable position to sleep. Use a firm mattress and lie on your side with your knees slightly bent. If you lie on your back, put a pillow under your knees.  · Only take over-the-counter or prescription medicines as directed by your caregiver. Over-the-counter medicines to reduce pain and inflammation are often the most helpful. Your caregiver may prescribe muscle relaxant drugs. These medicines help dull your pain so you can more quickly return to your normal activities and healthy exercise.  · Put ice on the injured area.  · Put ice in a plastic bag.  · Place a towel between your skin and the bag.  · Leave the ice on for 15-20 minutes, 3-4 times a day for the first 2 to 3 days. After that, ice and heat may be alternated to reduce pain and spasms.  · Ask your caregiver about trying back exercises and gentle massage. This may be of some benefit.  · Avoid feeling anxious or stressed. Stress increases muscle tension and can worsen back pain. It is important to recognize when you are anxious or stressed and learn ways to manage it. Exercise is a great option.  SEEK MEDICAL CARE IF:  · You have pain that is not relieved with rest or   medicine.  · You have pain that does not improve in 1 week.  · You have new symptoms.  · You are generally not feeling well.  SEEK IMMEDIATE MEDICAL CARE IF:   · You have pain that radiates from your back into your legs.  · You develop new bowel or bladder control problems.  · You have unusual weakness or numbness in your arms or legs.  · You develop nausea or vomiting.  · You develop abdominal pain.  · You feel faint.  Document Released: 12/27/2004 Document Revised: 06/28/2011 Document Reviewed: 05/17/2010  [employer]® Patient Information ©2014 [employer].

## 2012-08-28 NOTE — Progress Notes (Signed)
  Subjective:    Patient ID: Michael Harrison, male    DOB: 07-01-1970, 42 y.o.   MRN: [id number]  HPI This 42 y.o. male presents for evaluation of right back pain.  He was bending Over and pulled a muscle in his back.   Review of Systems C/o back pain   No chest pain, SOB, HA, dizziness, vision change, N/V, diarrhea, constipation, dysuria, urinary urgency or frequency, or rash.  Objective:   Physical Exam Vital signs noted  Well developed well nourished male.  HEENT - Head atraumatic Normocephalic                Throat - oropharanx wnl Respiratory - Lungs CTA bilateral Cardiac - RRR S1 and S2 without murmur GI - Abdomen soft Nontender and bowel sounds active x 4 MS - TTP right LS muscle, decrease ROM LS spine.       Assessment & Plan:  Back pain - Plan: cyclobenzaprine (FLEXERIL) 10 MG tablet, ibuprofen (ADVIL,MOTRIN) 600 MG tablet po tid x 10 days #30 Discussed using a heating pad prn.  Follow up prn.

## 2012-10-12 ENCOUNTER — Other Ambulatory Visit: Payer: Self-pay | Admitting: Family Medicine

## 2012-11-02 ENCOUNTER — Ambulatory Visit: Payer: BC Managed Care – PPO | Admitting: Family Medicine

## 2012-11-06 ENCOUNTER — Ambulatory Visit (INDEPENDENT_AMBULATORY_CARE_PROVIDER_SITE_OTHER): Payer: BC Managed Care – PPO | Admitting: Family Medicine

## 2012-11-06 ENCOUNTER — Encounter: Payer: Self-pay | Admitting: Family Medicine

## 2012-11-06 VITALS — BP 143/88 | HR 70 | Temp 97.0°F | Ht 70.0 in | Wt 386.6 lb

## 2012-11-06 DIAGNOSIS — I10 Essential (primary) hypertension: Secondary | ICD-10-CM

## 2012-11-06 DIAGNOSIS — G4733 Obstructive sleep apnea (adult) (pediatric): Secondary | ICD-10-CM

## 2012-11-06 DIAGNOSIS — E559 Vitamin D deficiency, unspecified: Secondary | ICD-10-CM

## 2012-11-06 DIAGNOSIS — E785 Hyperlipidemia, unspecified: Secondary | ICD-10-CM

## 2012-11-06 MED ORDER — ATORVASTATIN CALCIUM 40 MG PO TABS: 40.0000 mg | ORAL_TABLET | Freq: Every day | ORAL | Status: DC

## 2012-11-06 NOTE — Progress Notes (Signed)
Patient ID: Michael Harrison, male   DOB: 1970-12-11, 42 y.o.   MRN: [id number] SUBJECTIVE: CC: Chief Complaint  Patient presents with  . Follow-up    3 month ck up weight up 10 lbs.  please clarify his atorvastain strentgh.     HPI: 2 nights ago having an URI and now sinuses congested with a splitting headache last night better now.on was sick for 3 weeks with ear infections. Took sudafed and  Alleve. Since being on CPAP the wheezing resolved. Better these days  Patient is here for follow up of hyperlipidemia/HTN/OSA: denies Headache;denies Chest Pain;denies weakness;denies Shortness of Breath and orthopnea;denies Visual changes;denies palpitations;denies cough;denies pedal edema;denies symptoms of TIA or stroke;deniesClaudication symptoms. admits to Compliance with medications; Problems with medications.atorvastatin was  Supposed to be increased to 40 mg but was only renewed at 20 mg.  Weight went up because of vacation  Stopped sweet drinks.  Past Medical History  Diagnosis Date  . Hypertension   . Hyperlipidemia   . Vitamin D deficiency   . Morbid obesity   . OSA on CPAP    Past Surgical History  Procedure Laterality Date  . Hernia repair      umbilical hernia   . Lipoma excision      chest area   History   Social History  . Marital Status: Married    Spouse Name: N/A    Number of Children: N/A  . Years of Education: N/A   Occupational History  . Not on file.   Social History Main Topics  . Smoking status: Never Smoker   . Smokeless tobacco: Not on file  . Alcohol Use: No  . Drug Use: No  . Sexual Activity: Not on file   Other Topics Concern  . Not on file   Social History Narrative  . No narrative on file   Family History  Problem Relation Age of Onset  . Diabetes Mother   . Heart disease Mother    Current Outpatient Prescriptions on File Prior to Visit  Medication Sig Dispense Refill  . aspirin EC 81 MG tablet Take 1 tablet (81 mg total) by mouth  daily.  30 tablet  11  . Cholecalciferol (VITAMIN D3) 2000 UNITS TABS Take 2,000 Int'l Units by mouth daily.  30 tablet  11  . fluticasone (FLONASE) 50 MCG/ACT nasal spray Place 2 sprays into the nose daily.  16 g  2  . ibuprofen (ADVIL,MOTRIN) 600 MG tablet Take 1 tablet (600 mg total) by mouth every 8 (eight) hours as needed for pain.  30 tablet  0  . albuterol (PROVENTIL HFA;VENTOLIN HFA) 108 (90 BASE) MCG/ACT inhaler Inhale 2 puffs into the lungs every 6 (six) hours as needed for wheezing.  1 Inhaler  0  . cyclobenzaprine (FLEXERIL) 10 MG tablet Take 1 tablet (10 mg total) by mouth 3 (three) times daily as needed for muscle spasms.  30 tablet  0   No current facility-administered medications on file prior to visit.   Allergies  Allergen Reactions  . Iohexol      Desc: Notes indicated PT needs 13 hr prep    Immunization History  Administered Date(s) Administered  . Tdap 03/31/2012   Prior to Admission medications   Medication Sig Start Date End Date Taking? Authorizing Provider  aspirin EC 81 MG tablet Take 1 tablet (81 mg total) by mouth daily. 07/26/12  Yes Ileana Ladd, MD  atorvastatin (LIPITOR) 40 MG tablet Take 1 tablet (40 mg total) by  mouth daily. 07/28/12  Yes Ileana Ladd, MD  Cholecalciferol (VITAMIN D3) 2000 UNITS TABS Take 2,000 Int'l Units by mouth daily. 04/16/12  Yes Ileana Ladd, MD  fluticasone (FLONASE) 50 MCG/ACT nasal spray Place 2 sprays into the nose daily. 04/11/12  Yes Ileana Ladd, MD  ibuprofen (ADVIL,MOTRIN) 600 MG tablet Take 1 tablet (600 mg total) by mouth every 8 (eight) hours as needed for pain. 08/28/12  Yes Deatra Canter, FNP  albuterol (PROVENTIL HFA;VENTOLIN HFA) 108 (90 BASE) MCG/ACT inhaler Inhale 2 puffs into the lungs every 6 (six) hours as needed for wheezing. 03/31/12   Ileana Ladd, MD  cyclobenzaprine (FLEXERIL) 10 MG tablet Take 1 tablet (10 mg total) by mouth 3 (three) times daily as needed for muscle spasms. 08/28/12   Deatra Canter, FNP     ROS: As above in the HPI. All other systems are stable or negative.  OBJECTIVE: APPEARANCE:  Patient in no acute distress.The patient appeared well nourished and normally developed. Acyanotic. Waist: VITAL SIGNS:BP 143/88  Pulse 70  Temp(Src) 97 F (36.1 C) (Oral)  Ht 5\' 10"  (1.778 m)  Wt 386 lb 9.6 oz (175.361 kg)  BMI 55.47 kg/m2 Wm Morbidly obese SKIN: warm and  Dry without overt rashes, tattoos and scars. Xanthomas of the eyelids.  HEAD and Neck: without JVD, Head and scalp: normal Eyes:No scleral icterus. Fundi normal, eye movements normal. Ears: Auricle normal, canal normal, Tympanic membranes normal, insufflation normal. Nose: congested with rhinorrhea Sinuses : non tender Throat: normal Neck & thyroid: normal  CHEST & LUNGS: Chest wall: normal Lungs: Clear  CVS: Reveals the PMI to be normally located. Regular rhythm, First and Second Heart sounds are normal,  absence of murmurs, rubs or gallops. Peripheral vasculature: Radial pulses: normal Dorsal pedis pulses: normal Posterior pulses: normal  ABDOMEN:  Appearance: morbidly obese Benign, no organomegaly, no masses, no Abdominal Aortic enlargement. No Guarding , no rebound. No Bruits. Bowel sounds: normal  RECTAL: N/A GU: N/A  EXTREMETIES: nonedematous.  MUSCULOSKELETAL:  Spine: normal Joints: intact  NEUROLOGIC: oriented to time,place and person; nonfocal. Strength is normal Sensory is normal Reflexes are normal Cranial Nerves are normal. Results for orders placed in visit on 07/26/12  COMPLETE METABOLIC PANEL WITH GFR      Result Value Range   Sodium 141  135 - 145 mEq/L   Potassium 4.2  3.5 - 5.3 mEq/L   Chloride 102  96 - 112 mEq/L   CO2 26  19 - 32 mEq/L   Glucose, Bld 78  70 - 99 mg/dL   BUN 16  6 - 23 mg/dL   Creat 1.61  0.96 - 0.45 mg/dL   Total Bilirubin 0.5  0.3 - 1.2 mg/dL   Alkaline Phosphatase 111  39 - 117 U/L   AST 18  0 - 37 U/L   ALT 21  0 - 53 U/L    Total Protein 6.8  6.0 - 8.3 g/dL   Albumin 4.1  3.5 - 5.2 g/dL   Calcium 9.6  8.4 - 40.9 mg/dL   GFR, Est African American >89     GFR, Est Non African American >89    NMR LIPOPROFILE WITH LIPIDS      Result Value Range   LDL Particle Number [id number] (*) <1000 nmol/L   LDL (calc) 108 (*) <100 mg/dL   HDL-C 37 (*) >=81 mg/dL   Triglycerides 99  <191 mg/dL   Cholesterol, Total 478  <200 mg/dL  HDL Particle Number 29.8 (*) >=30.5 umol/L   Large HDL-P 1.6 (*) >=4.8 umol/L   Large VLDL-P 1.2  <=2.7 nmol/L   Small LDL Particle Number [id number] (*) <=527 nmol/L   LDL Size 20.2 (*) >20.5 nm   HDL Size 8.5 (*) >=9.2 nm   VLDL Size 42.0  <=46.6 nm   LP-IR Score 52 (*) <=45  VITAMIN D 25 HYDROXY      Result Value Range   Vit D, 25-Hydroxy 48  30 - 89 ng/mL    ASSESSMENT: Obstructive sleep apnea  Severe obesity (BMI >= 40)  HLD (hyperlipidemia) - Plan: atorvastatin (LIPITOR) 40 MG tablet, CMP14+EGFR, Lipid panel  HTN (hypertension) - Plan: CMP14+EGFR  Unspecified vitamin D deficiency - Plan: Vit D  25 hydroxy (rtn osteoporosis monitoring)  PLAN:  Orders Placed This Encounter  Procedures  . CMP14+EGFR  . Lipid panel  . Vit D  25 hydroxy (rtn osteoporosis monitoring)   Meds ordered this encounter  Medications  . DISCONTD: atorvastatin (LIPITOR) 40 MG tablet    Sig: Take 20 mg by mouth daily.  Marland Kitchen atorvastatin (LIPITOR) 40 MG tablet    Sig: Take 1 tablet (40 mg total) by mouth daily.    Dispense:  30 tablet    Refill:  11   Medications Discontinued During This Encounter  Medication Reason  . atorvastatin (LIPITOR) 20 MG tablet Duplicate  . atorvastatin (LIPITOR) 40 MG tablet   . atorvastatin (LIPITOR) 40 MG tablet Reorder  clarified with patient he is supposed to and have been on the 40 mg of the atorvastatin.refilled at 40 mg strength.  Await labs. Get back on track with dietary changes and exercise. Await 2 weeks before doing flushot. Due to the URI.   Return in about 4  months (around 03/09/2013) for Recheck medical problems.  Farah Benish P. [place] Charon, M.D.

## 2012-11-07 LAB — CMP14+EGFR
ALT: 30 IU/L (ref 0–44)
AST: 16 IU/L (ref 0–40)
Albumin/Globulin Ratio: 1.8 (ref 1.1–2.5)
Albumin: 3.9 g/dL (ref 3.5–5.5)
Alkaline Phosphatase: 123 IU/L — ABNORMAL HIGH (ref 39–117)
BUN/Creatinine Ratio: 22 — ABNORMAL HIGH (ref 9–20)
BUN: 19 mg/dL (ref 6–24)
CO2: 26 mmol/L (ref 18–29)
Calcium: 9.3 mg/dL (ref 8.7–10.2)
Chloride: 103 mmol/L (ref 97–108)
Creatinine, Ser: 0.86 mg/dL (ref 0.76–1.27)
GFR calc Af Amer: 125 mL/min/{1.73_m2} (ref >59)
GFR calc non Af Amer: 108 mL/min/{1.73_m2} (ref >59)
Globulin, Total: 2.2 g/dL (ref 1.5–4.5)
Glucose: 93 mg/dL (ref 65–99)
Potassium: 4.7 mmol/L (ref 3.5–5.2)
Sodium: 142 mmol/L (ref 134–144)
Total Bilirubin: 0.6 mg/dL (ref 0.0–1.2)
Total Protein: 6.1 g/dL (ref 6.0–8.5)

## 2012-11-07 LAB — LIPID PANEL
Chol/HDL Ratio: 3.7 ratio units (ref 0.0–5.0)
Cholesterol, Total: 136 mg/dL (ref 100–199)
HDL: 37 mg/dL — ABNORMAL LOW (ref >39)
LDL Calculated: 77 mg/dL (ref 0–99)
Triglycerides: 108 mg/dL (ref 0–149)
VLDL Cholesterol Cal: 22 mg/dL (ref 5–40)

## 2012-11-07 LAB — VITAMIN D 25 HYDROXY (VIT D DEFICIENCY, FRACTURES): Vit D, 25-Hydroxy: 29 ng/mL — ABNORMAL LOW (ref 30.0–100.0)

## 2012-11-16 ENCOUNTER — Telehealth: Payer: Self-pay | Admitting: Family Medicine

## 2012-11-16 NOTE — Telephone Encounter (Signed)
Patient aware.

## 2013-01-21 ENCOUNTER — Ambulatory Visit (INDEPENDENT_AMBULATORY_CARE_PROVIDER_SITE_OTHER): Payer: BC Managed Care – PPO | Admitting: Surgery

## 2013-01-21 ENCOUNTER — Encounter (INDEPENDENT_AMBULATORY_CARE_PROVIDER_SITE_OTHER): Payer: Self-pay | Admitting: Surgery

## 2013-01-21 VITALS — BP 140/90 | HR 72 | Temp 97.8°F | Resp 14 | Ht 70.0 in | Wt 399.8 lb

## 2013-01-21 DIAGNOSIS — K439 Ventral hernia without obstruction or gangrene: Secondary | ICD-10-CM | POA: Insufficient documentation

## 2013-01-21 NOTE — Patient Instructions (Signed)
If you decide not to have bariatric surgery and you would like to schedule the hernia repair, please call us at [phone] to let me know.  I will then do the paperwork and we will schedule your surgery.

## 2013-01-21 NOTE — Progress Notes (Signed)
Patient ID: Michael Harrison, male   DOB: 1970/06/08, 43 y.o.   MRN: [id number]  Chief Complaint  Patient presents with  . New Evaluation    eval possible Umbilical Hernia    HPI Michael Harrison is a 43 y.o. male.  Self-referred for recurrent ventral hernia  HPI This is a 43 year old male with severe morbid obesity who is status post ventral hernia repair with mesh on 10/03/07. This was a primary ventral hernia that was repaired with large ventral mesh and secured with 10 Prolene sutures. Unfortunately the patient went back to work the day after surgery. He did feel a pop on postop day #2 which may have been a suture that broke. Over the last several years he is gone up about 40 pounds and his weight and now is approaching 400 pounds. He has been considering bariatric surgery but has not shown any signs of being able to lose any weight on his own. He denies any obstructive symptoms. He is able to manually reduce the hernia. Past Medical History  Diagnosis Date  . Hypertension   . Hyperlipidemia   . Vitamin D deficiency   . Morbid obesity   . OSA on CPAP     Past Surgical History  Procedure Laterality Date  . Hernia repair      umbilical hernia   . Lipoma excision      chest area    Family History  Problem Relation Age of Onset  . Diabetes Mother   . Heart disease Mother     Social History History  Substance Use Topics  . Smoking status: Never Smoker   . Smokeless tobacco: Never Used  . Alcohol Use: No    Allergies  Allergen Reactions  . Iohexol      Desc: Notes indicated PT needs 13 hr prep     Current Outpatient Prescriptions  Medication Sig Dispense Refill  . albuterol (PROVENTIL HFA;VENTOLIN HFA) 108 (90 BASE) MCG/ACT inhaler Inhale 2 puffs into the lungs every 6 (six) hours as needed for wheezing.  1 Inhaler  0  . aspirin EC 81 MG tablet Take 1 tablet (81 mg total) by mouth daily.  30 tablet  11  . atorvastatin (LIPITOR) 40 MG tablet Take 1 tablet (40 mg total) by  mouth daily.  30 tablet  11  . fluticasone (FLONASE) 50 MCG/ACT nasal spray Place 2 sprays into the nose daily.  16 g  2  . Cholecalciferol (VITAMIN D3) 2000 UNITS TABS Take 2,000 Int'l Units by mouth daily.  30 tablet  11  . ibuprofen (ADVIL,MOTRIN) 600 MG tablet Take 1 tablet (600 mg total) by mouth every 8 (eight) hours as needed for pain.  30 tablet  0   No current facility-administered medications for this visit.    Review of Systems Review of Systems  Constitutional: Negative for fever, chills and unexpected weight change.  HENT: Negative for congestion, hearing loss, sore throat, trouble swallowing and voice change.   Eyes: Negative for visual disturbance.  Respiratory: Negative for cough and wheezing.   Cardiovascular: Negative for chest pain, palpitations and leg swelling.  Gastrointestinal: Positive for abdominal pain. Negative for nausea, vomiting, diarrhea, constipation, blood in stool, abdominal distention, anal bleeding and rectal pain.  Genitourinary: Negative for hematuria and difficulty urinating.  Musculoskeletal: Negative for arthralgias.  Skin: Negative for rash and wound.  Neurological: Negative for seizures, syncope, weakness and headaches.  Hematological: Negative for adenopathy. Does not bruise/bleed easily.  Psychiatric/Behavioral: Negative for confusion.  Blood pressure 140/90, pulse 72, temperature 97.8 F (36.6 C), temperature source Temporal, resp. rate 14, height 5\' 10"  (1.778 m), weight 399 lb 12.8 oz (181.348 kg).  Physical Exam Physical Exam Morbidly obese  in NAD HEENT:  EOMI, sclera anicteric Neck:  No masses, no thyromegaly Lungs:  CTA bilaterally; normal respiratory effort CV:  Regular rate and rhythm; no murmurs Abd:  +bowel sounds, soft, obese; healed supraumbilical midline incision Palpable mass to the left of the upper end of the incision Ext:  Well-perfused; no edema Skin:  Warm, dry; no sign of jaundice  Data  Reviewed none  Assessment    Recurrent or adjacent ventral hernia - supraumbilical; partially reducible     Plan    The patient needs laparoscopic ventral hernia repair with mesh.  He is considering bariatric surgery, which I recommended he have performed before placing mesh into the abdomen.  He was given information for the next bariatric information session.  If he decides not to have bariatric surgery, he will call us back to schedule the laparoscopic ventral hernia repair.        Malone Vanblarcom K. 01/21/2013, 10:52 AM

## 2013-03-06 ENCOUNTER — Encounter (INDEPENDENT_AMBULATORY_CARE_PROVIDER_SITE_OTHER): Payer: Self-pay | Admitting: Surgery

## 2013-03-06 ENCOUNTER — Ambulatory Visit (INDEPENDENT_AMBULATORY_CARE_PROVIDER_SITE_OTHER): Payer: BC Managed Care – PPO | Admitting: Surgery

## 2013-03-06 LAB — CBC WITH DIFFERENTIAL/PLATELET
BASOS PCT: 1 % (ref 0–1)
Basophils Absolute: 0.1 10*3/uL (ref 0.0–0.1)
EOS ABS: 0.9 10*3/uL — AB (ref 0.0–0.7)
EOS PCT: 9 % — AB (ref 0–5)
HEMATOCRIT: 43.5 % (ref 39.0–52.0)
HEMOGLOBIN: 14.2 g/dL (ref 13.0–17.0)
Lymphocytes Relative: 20 % (ref 12–46)
Lymphs Abs: 1.9 10*3/uL (ref 0.7–4.0)
MCH: 26.8 pg (ref 26.0–34.0)
MCHC: 32.6 g/dL (ref 30.0–36.0)
MCV: 82.2 fL (ref 78.0–100.0)
MONO ABS: 0.8 10*3/uL (ref 0.1–1.0)
MONOS PCT: 8 % (ref 3–12)
Neutro Abs: 6 10*3/uL (ref 1.7–7.7)
Neutrophils Relative %: 62 % (ref 43–77)
Platelets: 391 10*3/uL (ref 150–400)
RBC: 5.29 MIL/uL (ref 4.22–5.81)
RDW: 14.6 % (ref 11.5–15.5)
WBC: 9.6 10*3/uL (ref 4.0–10.5)

## 2013-03-06 LAB — COMPREHENSIVE METABOLIC PANEL
ALBUMIN: 4.1 g/dL (ref 3.5–5.2)
ALK PHOS: 111 U/L (ref 39–117)
ALT: 32 U/L (ref 0–53)
AST: 20 U/L (ref 0–37)
BILIRUBIN TOTAL: 0.5 mg/dL (ref 0.2–1.2)
BUN: 19 mg/dL (ref 6–23)
CO2: 24 mEq/L (ref 19–32)
CREATININE: 0.98 mg/dL (ref 0.50–1.35)
Calcium: 9.1 mg/dL (ref 8.4–10.5)
Chloride: 104 mEq/L (ref 96–112)
GLUCOSE: 80 mg/dL (ref 70–99)
Potassium: 4.4 mEq/L (ref 3.5–5.3)
Sodium: 140 mEq/L (ref 135–145)
Total Protein: 6.7 g/dL (ref 6.0–8.3)

## 2013-03-06 LAB — LIPID PANEL
CHOL/HDL RATIO: 3.8 ratio
Cholesterol: 129 mg/dL (ref 0–200)
HDL: 34 mg/dL — AB (ref >39)
LDL Cholesterol: 69 mg/dL (ref 0–99)
Triglycerides: 128 mg/dL (ref <150)
VLDL: 26 mg/dL (ref 0–40)

## 2013-03-06 NOTE — Addendum Note (Signed)
Addended by: Maryan PulsMOORE, Lou Loewe on: 03/06/2013 04:18 PM   Modules accepted: Orders

## 2013-03-06 NOTE — Progress Notes (Signed)
Chief Complaint:  Morbid obesity and ventral hernia  History of Present Illness:  Michael Harrison is an 43 y.o. male who and sent once his own [employer] [place] in [place] has been to our seminar and presents today to discuss bariatric surgery options. He does have some issues with arthritis and may need to take NSAIDS and is probably not a great candidate for a gastric bypass. Furthermore he has a large sheet of mesh his abdomen and adhesions may preclude a safe bypass. Therefore he may be best served with a sleeve gastrectomy. I went over that option with him in some detail. He would like to pursue that.  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 He has a elevated cholesterol and is followed and treated by Dr. Leodis SiasFrancis Wong and has periorbital xanthogranuomata.   He is a patient of Dr. Susy FrizzleMatt Tsuei's who made the referral.    Past Medical History  Diagnosis Date  . Hypertension   . Hyperlipidemia   . Vitamin D deficiency   . Morbid obesity   . OSA on CPAP     Past Surgical History  Procedure Laterality Date  . Hernia repair      umbilical hernia   . Lipoma excision      chest area    Current Outpatient Prescriptions  Medication Sig Dispense Refill  . albuterol (PROVENTIL HFA;VENTOLIN HFA) 108 (90 BASE) MCG/ACT inhaler Inhale 2 puffs into the lungs every 6 (six) hours as needed for wheezing.  1 Inhaler  0  . aspirin EC 81 MG tablet Take 1 tablet (81 mg total) by mouth daily.  30 tablet  11  . atorvastatin (LIPITOR) 40 MG tablet Take 1 tablet (40 mg total) by mouth daily.  30 tablet  11  . Cholecalciferol (VITAMIN D3) 2000 UNITS TABS Take 2,000 Int'l Units by mouth daily.  30 tablet  11  . fluticasone (FLONASE) 50 MCG/ACT nasal spray Place 2 sprays into the nose daily.  16 g  2  . ibuprofen (ADVIL,MOTRIN) 600 MG tablet Take 1 tablet (600 mg total) by mouth every 8 (eight) hours as needed for pain.  30 tablet  0   No  current facility-administered medications for this visit.   Iohexol Family History  Problem Relation Age of Onset  . Diabetes Mother   . Heart disease Mother    Social History:   reports that he has never smoked. He has never used smokeless tobacco. He reports that he does not drink alcohol or use illicit drugs.   REVIEW OF SYSTEMS - PERTINENT POSITIVES ONLY: No history of DVT  Physical Exam:   Blood pressure 132/82, pulse 84, resp. rate 18, height 5\' 10"  (1.778 m), weight 395 lb (179.171 kg). Body mass index is 56.68 kg/(m^2).  Gen:  WDWN WM NAD  Neurological: Alert and oriented to person, place, and time. Motor and sensory function is grossly intact  Head: Normocephalic and atraumatic.  Eyes: Conjunctivae are normal. Pupils are equal, round, and reactive to light. No scleral icterus.  Neck: Normal range of motion. Neck supple. No tracheal deviation or thyromegaly present.  Cardiovascular:  SR without murmurs or gallops.  No carotid bruits Respiratory: Effort normal.  No respiratory distress. No chest wall tenderness. Breath sounds normal.  No wheezes, rales or rhonchi.  Abdomen:  Obese with palpable asymmetry consistent with recurrent ventral hernia.   GU: Musculoskeletal: Normal range of motion. Extremities are nontender. No cyanosis,  edema or clubbing noted Lymphadenopathy: No cervical, preauricular, postauricular or axillary adenopathy is present Skin: Skin is warm and dry. No rash noted. No diaphoresis. No erythema. No pallor. Pscyh: Normal mood and affect. Behavior is normal. Judgment and thought content normal.   LABORATORY RESULTS: No results found for this or any previous visit (from the past 48 hour(s)).  RADIOLOGY RESULTS: No results found.  Problem List: Patient Active Problem List   Diagnosis Date Noted  . Ventral hernia 01/21/2013  . Sinusitis nasal 07/26/2012  . HTN (hypertension) 04/16/2012  . HLD (hyperlipidemia) 04/16/2012  . Severe obesity (BMI >= 40)  04/16/2012  . Unspecified vitamin D deficiency 04/16/2012  . Obstructive sleep apnea 04/16/2012  . Adjustment reaction 04/16/2012    Assessment & Plan: Morbid obesity with multiple comorbidities.   Begin workup for sleeve gastrectomy    Matt B. Daphine Deutscher, MD, [facility] [phone] beeper [phone]  03/06/2013 3:42 PM

## 2013-03-06 NOTE — Patient Instructions (Signed)
Sleeve Gastrectomy A sleeve gastrectomy is a surgery in which a large portion of the stomach is removed. After the surgery, the stomach will be a narrow tube about the size of a banana. This surgery is performed to help a person lose weight. The person loses weight because the reduced size of the stomach restricts the amount of food that the person can eat. The stomach will hold much less food than before the surgery. Also, the part of the stomach that is removed produces a hormone that causes hunger.  This surgery is done for people who have morbid obesity, defined as a body mass index (BMI) greater than 40. BMI is an estimate of body fat and is calculated from the height and weight of a person. This surgery may also be done for people with a BMI between 35 and 40 if they have other diseases, such as type 2 diabetes mellitus, obstructive sleep apnea, or heart and lung disorders (cardiopulmonary diseases).  LET YOUR HEALTH CARE PROVIDER KNOW ABOUT:  Any allergies you have.   All medicines you are taking, including vitamins, herbs, eyedrops, creams, and over-the-counter medicines.   Use of steroids (by mouth or creams).   Previous problems you or members of your family have had with the use of anesthetics.   Any blood disorders you have.   Previous surgeries you have had.   Possibility of pregnancy, if this applies.   Other health problems you have. RISKS AND COMPLICATIONS Generally, sleeve gastrectomy is a safe procedure. However, as with any procedure, complications can occur. Possible complications include:  Infection.  Bleeding.  Blood clots.  Damage to other organs or tissue.  Leakage of fluid from the stomach into the abdominal cavity (rare). BEFORE THE PROCEDURE  You may need to have blood tests and imaging tests (such as X-rays or ultrasonography) done before the day of surgery. A test to evaluate your esophagus and how it moves (esophageal manometry) may also be  done.  You may be placed on a liquid diet 2 3 weeks before the surgery.  Ask your health care provider about changing or stopping your regular medicines.  Do not eat or drink anything for at least 8 hours before the procedure.   Make plans to have someone drive you home after your hospital stay. Also arrange for someone to help you with activities during recovery. PROCEDURE  A laparoscopic technique is usually used for this surgery:  You will be given medicine to make you sleep through the procedure (general anesthetic). This medicine will be given through an intravenous (IV) access tube that is put into one of your veins.  Once you are asleep, your abdomen will be cleaned and sterilized.  Several small incisions will be made in your abdomen.  Your abdomen will be filled with air so that it expands. This gives the surgeon more room to operate and makes your organs easier to see.  A thin, lighted tube with a tiny camera on the end (laparoscope) is put through a small incision in your abdomen. The camera on the laparoscope sends a picture to a TV screen in the operating room. This gives the surgeon a good view inside the abdomen.  Hollow tubes are put through the other small incisions in your abdomen. The tools needed for the procedure are put through these tubes.  The surgeon uses staples to divide part of the stomach and then removes it through one of the incisions.  The remaining stomach may be reinforced using   stitches or surgical glue or both to prevent leakage of the stomach contents. A small tube (drain) may be placed through one of the incisions to allow extra fluid to flow from the area.  The incisions are closed with stitches, staples, or glue. AFTER THE PROCEDURE  You will be monitored closely in a recovery area. Once the anesthetic has worn off, you will likely be moved to a regular hospital room.  You will be given medicine for pain and nausea.   You may have a drain  from one of the incisions in your abdomen. If a drain is used, it may stay in place after you go home from the hospital and be removed at a follow-up appointment.   You will be encouraged to walk around several times a day. This helps prevent blood clots.  You will be started on a liquid diet the first day after your surgery. Sometimes a test is done to check for leaking before you can eat.  You will be urged to cough and do deep breathing exercises. This helps prevent a lung infection after a surgery.  You will likely need to stay in the hospital for a few days.  Document Released: 10/24/2008 Document Revised: 08/29/2012 Document Reviewed: 05/11/2012 [employer] Patient Information 2014 [employer].  

## 2013-03-07 LAB — T4: T4, Total: 10.9 ug/dL (ref 5.0–12.5)

## 2013-03-07 LAB — TSH: TSH: 2.17 u[IU]/mL (ref 0.350–4.500)

## 2013-03-14 ENCOUNTER — Encounter: Payer: Self-pay | Admitting: Family Medicine

## 2013-03-14 ENCOUNTER — Ambulatory Visit (INDEPENDENT_AMBULATORY_CARE_PROVIDER_SITE_OTHER): Payer: BC Managed Care – PPO | Admitting: Family Medicine

## 2013-03-14 VITALS — BP 130/79 | HR 87 | Temp 98.4°F | Ht 70.0 in | Wt 389.4 lb

## 2013-03-14 DIAGNOSIS — E785 Hyperlipidemia, unspecified: Secondary | ICD-10-CM

## 2013-03-14 DIAGNOSIS — I10 Essential (primary) hypertension: Secondary | ICD-10-CM

## 2013-03-14 DIAGNOSIS — E559 Vitamin D deficiency, unspecified: Secondary | ICD-10-CM

## 2013-03-14 DIAGNOSIS — K432 Incisional hernia without obstruction or gangrene: Secondary | ICD-10-CM | POA: Insufficient documentation

## 2013-03-14 DIAGNOSIS — G4733 Obstructive sleep apnea (adult) (pediatric): Secondary | ICD-10-CM

## 2013-03-14 MED ORDER — LORCASERIN HCL 10 MG PO TABS: 10.0000 mg | ORAL_TABLET | Freq: Two times a day (BID) | ORAL | Status: DC

## 2013-03-14 NOTE — Patient Instructions (Signed)
Obesity Obesity is defined as having too much total body fat and a body mass index (BMI) of 30 or more. BMI is an estimate of body fat and is calculated from your height and weight. Obesity happens when you consume more calories than you can burn by exercising or performing daily physical tasks. Prolonged obesity can cause major illnesses or emergencies, such as:   A stroke.  Heart disease.  Diabetes.  Cancer.  Arthritis.  High blood pressure (hypertension).  High cholesterol.  Sleep apnea.  Erectile dysfunction.  Infertility problems. CAUSES   Regularly eating unhealthy foods.  Physical inactivity.  Certain disorders, such as an underactive thyroid (hypothyroidism), Cushing's syndrome, and polycystic ovarian syndrome.  Certain medicines, such as steroids, some depression medicines, and antipsychotics.  Genetics.  Lack of sleep. DIAGNOSIS  A caregiver can diagnose obesity after calculating your BMI. Obesity will be diagnosed if your BMI is 30 or higher.  There are other methods of measuring obesity levels. Some other methods include measuring your skin fold thickness, your waist circumference, and comparing your hip circumference to your waist circumference. TREATMENT  A healthy treatment program includes some or all of the following:  Long-term dietary changes.  Exercise and physical activity.  Behavioral and lifestyle changes.  Medicine only under the supervision of your caregiver. Medicines may help, but only if they are used with diet and exercise programs. An unhealthy treatment program includes:  Fasting.  Fad diets.  Supplements and drugs. These choices do not succeed in long-term weight control.  HOME CARE INSTRUCTIONS   Exercise and perform physical activity as directed by your caregiver. To increase physical activity, try the following:  Use stairs instead of elevators.  Park farther away from store entrances.  Garden, bike, or walk instead of  watching television or using the computer.  Eat healthy, low-calorie foods and drinks on a regular basis. Eat more fruits and vegetables. Use low-calorie cookbooks or take healthy cooking classes.  Limit fast food, sweets, and processed snack foods.  Eat smaller portions.  Keep a daily journal of everything you eat. There are many free websites to help you with this. It may be helpful to measure your foods so you can determine if you are eating the correct portion sizes.  Avoid drinking alcohol. Drink more water and drinks without calories.  Take vitamins and supplements only as recommended by your caregiver.  Weight-loss support groups, Registered Dieticians, counselors, and stress reduction education can also be very helpful. SEEK IMMEDIATE MEDICAL CARE IF:  You have chest pain or tightness.  You have trouble breathing or feel short of breath.  You have weakness or leg numbness.  You feel confused or have trouble talking.  You have sudden changes in your vision. MAKE SURE YOU:  Understand these instructions.  Will watch your condition.  Will get help right away if you are not doing well or get worse. Document Released: 02/04/2004 Document Revised: 06/28/2011 Document Reviewed: 02/02/2011 Exit[employer] Patient Information 2014 [employer].        Dr Willetta York's Recommendations  For nutrition information, I recommend books:  1).Eat to Live by Dr Joel Fuhrman. 2).Prevent and Reverse Heart Disease by Dr Caldwell Esselstyn. 3) Dr Neal Barnard's Book:  Program to Reverse Diabetes  Exercise recommendations are:  If unable to walk, then the patient can exercise in a chair 3 times a day. By flapping arms like a bird gently and raising legs outwards to the front.  If ambulatory, the patient can go for   walks for 30 minutes 3 times a week. Then increase the intensity and duration as tolerated.  Goal is to try to attain exercise frequency to 5 times a week.  If  applicable: Best to perform resistance exercises (machines or weights) 2 days a week and cardio type exercises 3 days per week.  

## 2013-03-14 NOTE — Progress Notes (Signed)
Patient ID: Michael Harrison, male   DOB: 03-21-1970, 43 y.o.   MRN: [id number] SUBJECTIVE: CC: Chief Complaint  Patient presents with  . Hyperlipidemia    discuss lab results, and hernia repair    HPI: Breakfast: eggs and  Sausage, biscuit Lunch: skips Supper: fried salmon patties , baked beans and mac and cheese. Problems with sweets.  Exercise none.  Bought the Eat to Live book. Bought a blender. Did not use it or follow the book.  He has poor motivation to make the necessary lifestyle changes to lose weight. Came  To discuss about getting agreement signed for Bariatric  Surgery.  Past Medical History  Diagnosis Date  . Hypertension   . Hyperlipidemia   . Vitamin D deficiency   . Morbid obesity   . OSA on CPAP    Past Surgical History  Procedure Laterality Date  . Hernia repair      umbilical hernia   . Lipoma excision      chest area   History   Social History  . Marital Status: Married    Spouse Name: N/A    Number of Children: N/A  . Years of Education: N/A   Occupational History  . Not on file.   Social History Main Topics  . Smoking status: Never Smoker   . Smokeless tobacco: Never Used  . Alcohol Use: No  . Drug Use: No  . Sexual Activity: Not on file   Other Topics Concern  . Not on file   Social History Narrative  . No narrative on file   Family History  Problem Relation Age of Onset  . Diabetes Mother   . Heart disease Mother    Current Outpatient Prescriptions on File Prior to Visit  Medication Sig Dispense Refill  . albuterol (PROVENTIL HFA;VENTOLIN HFA) 108 (90 BASE) MCG/ACT inhaler Inhale 2 puffs into the lungs every 6 (six) hours as needed for wheezing.  1 Inhaler  0  . aspirin EC 81 MG tablet Take 1 tablet (81 mg total) by mouth daily.  30 tablet  11  . atorvastatin (LIPITOR) 40 MG tablet Take 1 tablet (40 mg total) by mouth daily.  30 tablet  11  . Cholecalciferol (VITAMIN D3) 2000 UNITS TABS Take 2,000 Int'l Units by mouth daily.   30 tablet  11  . fluticasone (FLONASE) 50 MCG/ACT nasal spray Place 2 sprays into the nose daily.  16 g  2  . ibuprofen (ADVIL,MOTRIN) 600 MG tablet Take 1 tablet (600 mg total) by mouth every 8 (eight) hours as needed for pain.  30 tablet  0   No current facility-administered medications on file prior to visit.   Allergies  Allergen Reactions  . Iohexol      Desc: Notes indicated PT needs 13 hr prep    Immunization History  Administered Date(s) Administered  . Tdap 03/31/2012   Prior to Admission medications   Medication Sig Start Date End Date Taking? Authorizing Provider  albuterol (PROVENTIL HFA;VENTOLIN HFA) 108 (90 BASE) MCG/ACT inhaler Inhale 2 puffs into the lungs every 6 (six) hours as needed for wheezing. 03/31/12  Yes Vernie Shanks, MD  aspirin EC 81 MG tablet Take 1 tablet (81 mg total) by mouth daily. 07/26/12  Yes Vernie Shanks, MD  atorvastatin (LIPITOR) 40 MG tablet Take 1 tablet (40 mg total) by mouth daily. 11/06/12  Yes Vernie Shanks, MD  Cholecalciferol (VITAMIN D3) 2000 UNITS TABS Take 2,000 Int'l Units by mouth daily. 04/16/12  Yes Vernie Shanks, MD  fluticasone [facility]) 50 MCG/ACT nasal spray Place 2 sprays into the nose daily. 04/11/12  Yes Vernie Shanks, MD  ibuprofen (ADVIL,MOTRIN) 600 MG tablet Take 1 tablet (600 mg total) by mouth every 8 (eight) hours as needed for pain. 08/28/12  Yes Lysbeth Penner, FNP     ROS: As above in the HPI. All other systems are stable or negative.  OBJECTIVE: APPEARANCE:  Patient in no acute distress.The patient appeared well nourished and normally developed. Acyanotic. Waist: VITAL SIGNS:BP 130/79  Pulse 87  Temp(Src) 98.4 F (36.9 C) (Oral)  Ht _0  (1.778 m)  Wt 389 lb 6.4 oz (176.631 kg)  BMI 55.87 kg/m2 morbidly  WM with Xanthomatous plaques on the eyelids.  SKIN: warm and  Dry without overt rashes, tattoos and scars  HEAD and Neck: without JVD, Head and scalp: normal Eyes:No scleral icterus. Fundi  normal, eye movements normal. Ears: Auricle normal, canal normal, Tympanic membranes normal, insufflation normal. Nose: normal Throat: normal Neck & thyroid: normal  CHEST & LUNGS: Chest wall: normal Lungs: Clear  CVS: Reveals the PMI to be normally located. Regular rhythm, First and Second Heart sounds are normal,  absence of murmurs, rubs or gallops. Peripheral vasculature: Radial pulses: normal Dorsal pedis pulses: normal Posterior pulses: normal  ABDOMEN:  Appearance: obese. Ventral hernia.  Benign, no organomegaly, no masses, no Abdominal Aortic enlargement. No Guarding , no rebound. No Bruits. Bowel sounds: normal  RECTAL: N/A GU: N/A  EXTREMETIES: nonedematous.  MUSCULOSKELETAL:  Spine: normal Joints: crepitus of the right knee.  NEUROLOGIC: oriented to time,place and person; nonfocal. Strength is normal Sensory is normal Reflexes are normal Cranial Nerves are normal.  Results for orders placed in visit on 03/06/13  CBC WITH DIFFERENTIAL      Result Value Ref Range   WBC 9.6  4.0 - 10.5 K/uL   RBC 5.29  4.22 - 5.81 MIL/uL   Hemoglobin 14.2  13.0 - 17.0 g/dL   HCT 43.5  39.0 - 52.0 %   MCV 82.2  78.0 - 100.0 fL   MCH 26.8  26.0 - 34.0 pg   MCHC 32.6  30.0 - 36.0 g/dL   RDW 14.6  11.5 - 15.5 %   Platelets 391  150 - 400 K/uL   Neutrophils Relative % 62  43 - 77 %   Neutro Abs 6.0  1.7 - 7.7 K/uL   Lymphocytes Relative 20  12 - 46 %   Lymphs Abs 1.9  0.7 - 4.0 K/uL   Monocytes Relative 8  3 - 12 %   Monocytes Absolute 0.8  0.1 - 1.0 K/uL   Eosinophils Relative 9 (*) 0 - 5 %   Eosinophils Absolute 0.9 (*) 0.0 - 0.7 K/uL   Basophils Relative 1  0 - 1 %   Basophils Absolute 0.1  0.0 - 0.1 K/uL   Smear Review Criteria for review not met    COMPREHENSIVE METABOLIC PANEL      Result Value Ref Range   Sodium 140  135 - 145 mEq/L   Potassium 4.4  3.5 - 5.3 mEq/L   Chloride 104  96 - 112 mEq/L   CO2 24  19 - 32 mEq/L   Glucose, Bld 80  70 - 99 mg/dL    BUN 19  6 - 23 mg/dL   Creat 0.98  0.50 - 1.35 mg/dL   Total Bilirubin 0.5  0.2 - 1.2 mg/dL   Alkaline Phosphatase 111  39 - 117 U/L   AST 20  0 - 37 U/L   ALT 32  0 - 53 U/L   Total Protein 6.7  6.0 - 8.3 g/dL   Albumin 4.1  3.5 - 5.2 g/dL   Calcium 9.1  8.4 - 10.5 mg/dL  TSH      Result Value Ref Range   TSH 2.170  0.350 - 4.500 uIU/mL  T4      Result Value Ref Range   T4, Total 10.9  5.0 - 12.5 ug/dL  LIPID PANEL      Result Value Ref Range   Cholesterol 129  0 - 200 mg/dL   Triglycerides 128  <150 mg/dL   HDL 34 (*) >39 mg/dL   Total CHOL/HDL Ratio 3.8     VLDL 26  0 - 40 mg/dL   LDL Cholesterol 69  0 - 99 mg/dL    ASSESSMENT:  HLD (hyperlipidemia)  Severe obesity (BMI >= 40) - Plan: Lorcaserin HCl 10 MG TABS  Obstructive sleep apnea  Unspecified vitamin D deficiency  HTN (hypertension)  INCI HERNIA WITHOUT MENTION OBSTRUCTION/GANGRENE   agree that to reduce his morbidity and mortality he should consider bariatric  Surgery. However we discussed the concerns that I have that he was never motivated to do abnything to change his diet and to exercise and his lifestyle , what would he be committed to do postop? His risk of failure with postop period can occur. He never gave a trial of significant dieting and exercise since I have taken care of him. He is now willing to give it  A shot.   PLAN: Affirmed the risks and benefits that the surgeons had  Discussed and the group sessions that he has been to.       Dr Paula Libra Recommendations  For nutrition information, I recommend books:  1).Eat to Live by Dr Excell Seltzer. 2).Prevent and Reverse Heart Disease by Dr Karl Luke. 3) Dr Janene Harvey Book:  Program to Reverse Diabetes  Exercise recommendations are:  If unable to walk, then the patient can exercise in a chair 3 times a day. By flapping arms like a bird gently and raising legs outwards to the front.  If ambulatory, the patient can go for  walks for 30 minutes 3 times a week. Then increase the intensity and duration as tolerated.  Goal is to try to attain exercise frequency to 5 times a week.  If applicable: Best to perform resistance exercises (machines or weights) 2 days a week and cardio type exercises 3 days per week.  No orders of the defined types were placed in this encounter.   Meds ordered this encounter  Medications  . Lorcaserin HCl 10 MG TABS    Sig: Take 10 mg by mouth 2 (two) times daily before a meal.    Dispense:  60 tablet    Refill:  2   There are no discontinued medications. Return in about 2 months (around 05/14/2013) for Recheck medical problems, recheck weight.  Nilson Tabora P. Jacelyn Grip, M.D.

## 2013-03-21 ENCOUNTER — Telehealth: Payer: Self-pay | Admitting: Family Medicine

## 2013-03-26 ENCOUNTER — Telehealth: Payer: Self-pay | Admitting: Family Medicine

## 2013-03-26 NOTE — Telephone Encounter (Signed)
Printed message and gave to Carren Rangonna Lawson

## 2013-03-27 NOTE — Telephone Encounter (Signed)
The rx for lorcaserin is over $200 and they can not afford that please advise

## 2013-03-29 NOTE — Telephone Encounter (Signed)
Patient aware.

## 2013-03-29 NOTE — Telephone Encounter (Signed)
He needs to be on a vegan/plant based  Diet. The other medications have a lot of risks.I don't usually Rx diet pills. This one lorcaserin is the only one I feel comfortable with.

## 2013-04-02 ENCOUNTER — Ambulatory Visit (HOSPITAL_COMMUNITY)
Admission: RE | Admit: 2013-04-02 | Discharge: 2013-04-02 | Disposition: A | Discharge status: Home / Self Care | Payer: BC Managed Care – PPO | Source: Ambulatory Visit | Attending: Surgery | Admitting: Surgery

## 2013-04-02 ENCOUNTER — Other Ambulatory Visit: Payer: Self-pay

## 2013-04-02 DIAGNOSIS — Z6841 Body Mass Index (BMI) 40.0 and over, adult: Secondary | ICD-10-CM | POA: Insufficient documentation

## 2013-04-02 DIAGNOSIS — G4733 Obstructive sleep apnea (adult) (pediatric): Secondary | ICD-10-CM | POA: Insufficient documentation

## 2013-04-02 DIAGNOSIS — E785 Hyperlipidemia, unspecified: Secondary | ICD-10-CM | POA: Insufficient documentation

## 2013-04-02 DIAGNOSIS — K449 Diaphragmatic hernia without obstruction or gangrene: Secondary | ICD-10-CM | POA: Insufficient documentation

## 2013-04-02 DIAGNOSIS — E559 Vitamin D deficiency, unspecified: Secondary | ICD-10-CM | POA: Insufficient documentation

## 2013-04-02 DIAGNOSIS — I10 Essential (primary) hypertension: Secondary | ICD-10-CM | POA: Insufficient documentation

## 2013-04-19 ENCOUNTER — Encounter: Payer: Self-pay | Admitting: *Deleted

## 2013-04-22 ENCOUNTER — Encounter (HOSPITAL_COMMUNITY)
Admission: RE | Disposition: A | Discharge status: Home / Self Care | Payer: Self-pay | Source: Ambulatory Visit | Attending: Surgery

## 2013-04-22 ENCOUNTER — Ambulatory Visit (HOSPITAL_COMMUNITY)
Admission: RE | Admit: 2013-04-22 | Discharge: 2013-04-22 | Disposition: A | Discharge status: Home / Self Care | Payer: BC Managed Care – PPO | Source: Ambulatory Visit | Attending: Surgery | Admitting: Surgery

## 2013-04-22 DIAGNOSIS — Z01818 Encounter for other preprocedural examination: Secondary | ICD-10-CM | POA: Insufficient documentation

## 2013-04-22 HISTORY — PX: BREATH TEK H PYLORI: SHX5422

## 2013-04-22 SURGERY — BREATH TEST, FOR HELICOBACTER PYLORI

## 2013-04-22 NOTE — Progress Notes (Signed)
04/22/13 0847  BREATH TEK ASSESSMENT  Referring MD Luretha MurphyMatthew Martin  Time of Last PO Intake 2100 (on 04/21/13)  Baseline Breath At: 0820  Pranactin Given At: 0823  Post-Dose Breath At: 0838  Sample 1 2.3  Sample 2 2.3  Test Postive

## 2013-04-23 ENCOUNTER — Encounter (HOSPITAL_COMMUNITY): Payer: Self-pay | Admitting: Surgery

## 2013-04-24 ENCOUNTER — Encounter: Payer: BC Managed Care – PPO | Attending: Surgery | Admitting: Dietician

## 2013-04-24 ENCOUNTER — Encounter: Payer: Self-pay | Admitting: Dietician

## 2013-04-24 DIAGNOSIS — Z713 Dietary counseling and surveillance: Secondary | ICD-10-CM | POA: Insufficient documentation

## 2013-04-24 DIAGNOSIS — Z01818 Encounter for other preprocedural examination: Secondary | ICD-10-CM | POA: Insufficient documentation

## 2013-04-24 DIAGNOSIS — Z6841 Body Mass Index (BMI) 40.0 and over, adult: Secondary | ICD-10-CM | POA: Insufficient documentation

## 2013-04-24 NOTE — Progress Notes (Signed)
  Pre-Op Assessment Visit:  Pre-Operative gastric sleeve Surgery  Medical Nutrition Therapy:  Appt start time: 1030   End time:  1115.  Patient was seen on 04/24/2013 for Pre-Operative Gastric sleeve Nutrition Assessment. [facility] on 04/24/2013.   Preferred Learning Style:   No preference indicated   Learning Readiness:   Contemplating  Ready   Handouts given during visit include:  Pre-Op Goals Bariatric Surgery Protein Shakes Bariatric Fast food guide  Teaching Method Utilized: Visual Auditory Hands on  Barriers to learning/adherence to lifestyle change: none  Demonstrated degree of understanding via:  Teach Back   [facility] to [facility] the [facility] to enroll in Pre-Op and Post-Op Nutrition Education when surgery date is scheduled.

## 2013-04-24 NOTE — Patient Instructions (Signed)
Patient to call the [facility] to enroll in Pre-Op and Post-Op Nutrition Education when surgery date is scheduled. 

## 2013-05-10 ENCOUNTER — Ambulatory Visit: Payer: Self-pay | Admitting: Family Medicine

## 2013-05-16 ENCOUNTER — Ambulatory Visit: Payer: BC Managed Care – PPO | Admitting: Family Medicine

## 2013-12-20 ENCOUNTER — Ambulatory Visit (INDEPENDENT_AMBULATORY_CARE_PROVIDER_SITE_OTHER): Payer: Self-pay | Admitting: Surgery

## 2013-12-20 NOTE — H&P (Signed)
The patient is a 43 year old male who presents with an incisional hernia. This is a 43 year old male with severe morbid obesity who is status post ventral hernia repair with mesh on 10/03/07. This was a primary ventral hernia that was repaired with large ventraex mesh and secured with 10 Prolene sutures. Unfortunately the patient went back to work the day after surgery. He did feel a pop on postop day #2 which may have been a suture that broke. Over the last several years he is gone up about 40 pounds and his weight and now is approaching 400 pounds. He has been considering bariatric surgery but has not shown any signs of being able to lose any weight on his own. He denies any obstructive symptoms. He was examined in January 2015, but was considering bariatric surgery. The hernia has become larger, so he wants to proceed with hernia repair at this time. Other Problems Kerrie Buffalo(Michele Daniels, CMA; 12/20/2013 10:12 AM) Back Pain Diverticulosis Hypercholesterolemia Sleep Apnea Umbilical Hernia Repair  Past Surgical History Kerrie Buffalo(Michele Daniels, CMA; 12/20/2013 10:12 AM) Open Inguinal Hernia Surgery Right.  Diagnostic Studies History Kerrie Buffalo(Michele Daniels, CMA; 12/20/2013 10:12 AM) Colonoscopy 1-5 years ago  Allergies Kerrie Buffalo(Michele Daniels, CMA; 12/20/2013 10:13 AM) Iohexol *DIAGNOSTIC PRODUCTS*  Medication History Kerrie Buffalo(Michele Daniels, CMA; 12/20/2013 10:14 AM) Acetaminophen-Codeine #3 (300-30MG  Tablet, Oral) Active. Cyclobenzaprine HCl (10MG  Tablet, Oral) Active. Atorvastatin Calcium (40MG  Tablet, Oral) Active. Naproxen (500MG  Tablet, Oral) Active.  Social History Kerrie Buffalo(Michele Daniels, CMA; 12/20/2013 10:12 AM) Caffeine use Tea. Tobacco use Never smoker.  Family History Kerrie Buffalo(Michele Daniels, CMA; 12/20/2013 10:12 AM) Colon Polyps Mother. Diabetes Mellitus Mother. Heart disease in male family member before age 865 Kidney Disease Mother.     Review of Systems Kerrie Buffalo(Michele Daniels CMA; 12/20/2013  10:12 AM) General Not Present- Appetite Loss, Chills, Fatigue, Fever, Night Sweats, Weight Gain and Weight Loss. Skin Not Present- Change in Wart/Mole, Dryness, Hives, Jaundice, New Lesions, Non-Healing Wounds, Rash and Ulcer. HEENT Present- Sinus Pain and Sore Throat. Not Present- Earache, Hearing Loss, Hoarseness, Nose Bleed, Oral Ulcers, Ringing in the Ears, Seasonal Allergies, Visual Disturbances, Wears glasses/contact lenses and Yellow Eyes. Respiratory Not Present- Bloody sputum, Chronic Cough, Difficulty Breathing, Snoring and Wheezing. Breast Not Present- Breast Mass, Breast Pain, Nipple Discharge and Skin Changes. Cardiovascular Not Present- Chest Pain, Difficulty Breathing Lying Down, Leg Cramps, Palpitations, Rapid Heart Rate, Shortness of Breath and Swelling of Extremities. Gastrointestinal Not Present- Abdominal Pain, Bloating, Bloody Stool, Change in Bowel Habits, Chronic diarrhea, Constipation, Difficulty Swallowing, Excessive gas, Gets full quickly at meals, Hemorrhoids, Indigestion, Nausea, Rectal Pain and Vomiting. Male Genitourinary Not Present- Blood in Urine, Change in Urinary Stream, Frequency, Impotence, Nocturia, Painful Urination, Urgency and Urine Leakage.  Vitals Kerrie Buffalo(Michele Daniels CMA; 12/20/2013 10:14 AM) 12/20/2013 10:14 AM Weight: 395.38 lb Height: 70in Body Surface Area: 2.98 m Body Mass Index: 56.73 kg/m Temp.: 98.68F  Pulse: 88 (Regular)  BP: 120/80 (Sitting, Left Arm, Standard)     Physical Exam Molli Hazard(Kolt Mcwhirter K. Zenia Guest MD; 12/20/2013 11:02 AM)  The physical exam findings are as follows: Note:WDWN in NAD HEENT: EOMI, sclera anicteric Neck: No masses, no thyromegaly Lungs: CTA bilaterally; normal respiratory effort CV: Regular rate and rhythm; no murmurs Abd: +bowel sounds, obese, healed midline incision above umbilicus; protruding bulge the left of the upper end of the incision; not reducible Ext: Well-perfused; no edema Skin: Warm, dry; no sign  of jaundice    Assessment & Plan Molli Hazard(Lelan Cush K. 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 The surgical procedure has been discussed with the patient. Potential risks, benefits, alternative treatments, and expected outcomes have been explained. All of the patient's questions at this time have been answered. The likelihood of reaching the patient's treatment goal is good. The patient understand the proposed surgical procedure and wishes to proceed.   Bring your CPAP mask and machine with you to the hospital. You will stay at least one night in the hospital.    Wilmon ArmsMatthew K. Corliss Skainssuei, MD, [facility]  General/ Trauma Surgery  12/20/2013 11:05 AM

## 2014-01-08 ENCOUNTER — Other Ambulatory Visit: Payer: Self-pay | Admitting: Family Medicine

## 2014-01-08 ENCOUNTER — Other Ambulatory Visit: Payer: Self-pay

## 2014-01-08 DIAGNOSIS — E785 Hyperlipidemia, unspecified: Secondary | ICD-10-CM

## 2014-01-08 MED ORDER — ATORVASTATIN CALCIUM 40 MG PO TABS: 40.0000 mg | ORAL_TABLET | Freq: Every day | ORAL | Status: DC

## 2014-03-21 ENCOUNTER — Other Ambulatory Visit: Payer: Self-pay | Admitting: Family Medicine

## 2015-04-15 DIAGNOSIS — R69 Illness, unspecified: Secondary | ICD-10-CM | POA: Diagnosis not present

## 2015-04-16 DIAGNOSIS — R42 Dizziness and giddiness: Secondary | ICD-10-CM | POA: Diagnosis not present

## 2015-04-20 DIAGNOSIS — R42 Dizziness and giddiness: Secondary | ICD-10-CM | POA: Diagnosis not present

## 2015-05-21 DIAGNOSIS — E785 Hyperlipidemia, unspecified: Secondary | ICD-10-CM | POA: Diagnosis not present

## 2015-05-21 DIAGNOSIS — R42 Dizziness and giddiness: Secondary | ICD-10-CM | POA: Diagnosis not present

## 2015-06-02 DIAGNOSIS — J019 Acute sinusitis, unspecified: Secondary | ICD-10-CM | POA: Diagnosis not present

## 2015-06-12 DIAGNOSIS — R0600 Dyspnea, unspecified: Secondary | ICD-10-CM | POA: Diagnosis not present

## 2015-07-28 DIAGNOSIS — M25562 Pain in left knee: Secondary | ICD-10-CM | POA: Diagnosis not present

## 2015-07-28 DIAGNOSIS — M25561 Pain in right knee: Secondary | ICD-10-CM | POA: Diagnosis not present

## 2015-11-13 DIAGNOSIS — G4733 Obstructive sleep apnea (adult) (pediatric): Secondary | ICD-10-CM | POA: Diagnosis not present

## 2016-01-05 DIAGNOSIS — J00 Acute nasopharyngitis [common cold]: Secondary | ICD-10-CM | POA: Diagnosis not present

## 2016-02-10 DIAGNOSIS — E782 Mixed hyperlipidemia: Secondary | ICD-10-CM | POA: Diagnosis not present

## 2016-02-10 DIAGNOSIS — K439 Ventral hernia without obstruction or gangrene: Secondary | ICD-10-CM | POA: Diagnosis not present

## 2016-02-10 DIAGNOSIS — E785 Hyperlipidemia, unspecified: Secondary | ICD-10-CM | POA: Diagnosis not present

## 2016-02-15 ENCOUNTER — Ambulatory Visit: Payer: Self-pay | Admitting: Surgery

## 2016-02-15 DIAGNOSIS — K430 Incisional hernia with obstruction, without gangrene: Secondary | ICD-10-CM | POA: Diagnosis not present

## 2016-02-15 NOTE — H&P (Signed)
History of Present Illness Michael Harrison(Amer Alcindor K. Mylz Yuan MD; 02/15/2016 10:39 AM) The patient is a 46 year old male who presents with an abdominal wall hernia. This is a 46 year old male with severe morbid obesity who is status post ventral hernia repair with mesh on 10/03/07. This was a primary ventral hernia that was repaired with large ventralex mesh and secured with 10 Prolene sutures. Unfortunately the patient went back to work the day after surgery. He did feel a pop on postop day #2 which may have been a suture that broke. Over the last several years he is gone up over 60 pounds and his weight and was as high as 420 pounds. He has been considering bariatric surgery but has not shown any signs of being able to lose any weight on his own. He denies any obstructive symptoms. He was examined in January 2015, but was considering bariatric surgery. We saw the patient in December 2015 and recommended surgery at that time. However the patient chose not to have surgery because of financial reasons. The hernia has become larger and more uncomfortable. He denies any obstructive symptoms. He would like to have it repaired at this time.  Patient has obstructive sleep apnea and uses a CPAP at night.    Problem List/Past Medical Molli Hazard(Janea Schwenn K. Maggy Wyble, MD; 02/15/2016 10:39 AM) RECURRENT VENTRAL HERNIA WITH INCARCERATION (K43.0)  Past Surgical History (Erika Slaby K. Lorris Carducci, MD; 02/15/2016 10:39 AM) Open Inguinal Hernia Surgery Right.  Diagnostic Studies History Michael Harrison(Rumeal Cullipher K. Lygia Olaes, MD; 02/15/2016 10:39 AM) Colonoscopy 1-5 years ago  Allergies Barron Alvine(Sade MechanicvilleBradford, CMA; 02/15/2016 9:41 AM) Iohexol *DIAGNOSTIC PRODUCTS*  Medication History Timmothy Euler(Sade Bradford, CMA; 02/15/2016 9:42 AM) Aspirin (81MG  Tablet, Oral daily) Active. Atorvastatin Calcium (40MG  Tablet, Oral) Active. Naproxen (500MG  Tablet, Oral) Active. Medications Reconciled  Social History Michael Harrison(Taft Worthing K. Esmay Amspacher, MD; 02/15/2016 10:39 AM) Caffeine use Tea. Tobacco use Never  smoker.  Family History Michael Harrison(Myla Mauriello K. Aviyon Hocevar, MD; 02/15/2016 10:39 AM) Colon Polyps Mother. Diabetes Mellitus Mother. Heart disease in male family member before age 45 Kidney Disease Mother.  Other Problems Michael Harrison(Kailani Brass K. March Joos, MD; 02/15/2016 10:39 AM) Back Pain Diverticulosis Hypercholesterolemia Sleep Apnea Umbilical Hernia Repair    Vitals (Sade Bradford CMA; 02/15/2016 9:42 AM) 02/15/2016 9:42 AM Weight: 401 lb Height: 70in Body Surface Area: 2.81 m Body Mass Index: 57.54 kg/m  Temp.: 72F  Pulse: 82 (Regular)  BP: 122/82 (Sitting, Left Arm, Standard)      Physical Exam Molli Hazard(Ante Arredondo K. Valmore Arabie MD; 02/15/2016 10:41 AM)  The physical exam findings are as follows: Note:WDWN in NAD HEENT: EOMI, sclera anicteric Neck: No masses, no thyromegaly Lungs: CTA bilaterally; normal respiratory effort CV: Regular rate and rhythm; no murmurs Abd: +bowel sounds, obese, healed midline incision above umbilicus; protruding bulge the left of the upper end of the incision; not reducible. Hernia sac measures about 8 cm across Ext: Well-perfused; no edema Skin: Warm, dry; no sign of jaundice    Assessment & Plan Molli Hazard(Dawnita Molner K. Dorri Ozturk MD; 02/15/2016 10:42 AM)  RECURRENT VENTRAL HERNIA WITH INCARCERATION (K43.0)  Current Plans Schedule for Surgery - Laparoscopic ventral hernia repair with mesh. The surgical procedure has been discussed with the patient. Potential risks, benefits, alternative treatments, and expected outcomes have been explained. All of the patient's questions at this time have been answered. The likelihood of reaching the patient's treatment goal is good. The patient understand the proposed surgical procedure and wishes to proceed. Note:I explained to him that his recurrence risk remains elevated because of his morbid obesity. Also, at the last  surgery he did not follow postoperative instructions and went to work the day after surgery. This likely contributed to  his recurrence. The patient assures me that he will follow instructions this time. He has lost about 10 pounds over the last month by switching to a low carb diet. I encouraged him to continue this.  Michael Harrison. Corliss Skains, MD, [facility]  General/ Trauma Surgery  02/15/2016 10:42 AM

## 2016-03-03 ENCOUNTER — Encounter (HOSPITAL_COMMUNITY): Payer: Self-pay

## 2016-03-03 NOTE — Pre-Procedure Instructions (Signed)
Jarold SongScotty Stieg  03/03/2016      [id number] - [place], [place] - [address] [place] [id number] Phone: [phone] Fax: [phone]  [facility] - [place], [place] - [address] [place] [address] Phone: [phone] Fax: [phone]    Your procedure is scheduled on Wed. Feb. 28  [facility] Ad[facility] at 6:30 A.M.  Call this number if you have problems the morning of surgery:  [phone]   Remember:  Do not eat food or drink liquids after midnight.  Take these medicines the morning of surgery with A SIP OF WATER : flonase nasal spray if needed             Stop aspirin, advil, motrin, aleve, BC Powders, Goody's, vitamins/herbal medicines.   Do not wear jewelry.  Do not wear lotions, powders, or cologne, or deoderant.  Do not shave 48 hours prior to surgery.  Men may shave face and neck.  Do not bring valuables to the hospital.  [facility] is not responsible for any belongings or valuables.  Contacts, dentures or bridgework may not be worn into surgery.  Leave your suitcase in the car.  After surgery it may be brought to your room.  For patients admitted to the hospital, discharge time will be determined by your treatment team.  Patients discharged the day of surgery will not be allowed to drive home.   Name and phone number of your driver:   Special instructions:   [place]- Preparing For Surgery  Before surgery, you can play an important role. Because skin is not sterile, your skin needs to be as free of germs as possible. You can reduce the number of germs on your skin by washing with CHG (chlorahexidine gluconate) Soap before surgery.  CHG is an antiseptic cleaner which kills germs and bonds with the skin to continue killing germs even after washing.  Please do not use if you have an allergy to CHG or antibacterial soaps. If your skin becomes reddened/irritated stop using  the CHG.  Do not shave (including legs and underarms) for at least 48 hours prior to first CHG shower. It is OK to shave your face.  Please follow these instructions carefully.   1. Shower the NIGHT BEFORE SURGERY and the MORNING OF SURGERY with CHG.   2. If you chose to wash your hair, wash your hair first as usual with your normal shampoo.  3. After you shampoo, rinse your hair and body thoroughly to remove the shampoo.  4. Use CHG as you would any other liquid soap. You can apply CHG directly to the skin and wash gently with a scrungie or a clean washcloth.   5. Apply the CHG Soap to your body ONLY FROM THE NECK DOWN.  Do not use on open wounds or open sores. Avoid contact with your eyes, ears, mouth and genitals (private parts). Wash genitals (private parts) with your normal soap.  6. Wash thoroughly, paying special attention to the area where your surgery will be performed.  7. Thoroughly rinse your body with warm water from the neck down.  8. DO NOT shower/wash with your normal soap after using and rinsing off the CHG Soap.  9. Pat yourself dry with a CLEAN TOWEL.   10. Wear CLEAN PAJAMAS   11. Place CLEAN SHEETS on your bed the night of your first shower and DO NOT SLEEP WITH PETS.  Day of Surgery: Do not apply any deodorants/lotions. Please wear clean clothes to the hospital/surgery center.      Please read over the following fact sheets that you were given. Coughing and Deep Breathing and Surgical Site Infection Prevention

## 2016-03-04 ENCOUNTER — Inpatient Hospital Stay (HOSPITAL_COMMUNITY)
Admission: RE | Admit: 2016-03-04 | Discharge: 2016-03-04 | Disposition: A | Discharge status: Home / Self Care | Payer: Self-pay | Source: Ambulatory Visit

## 2016-03-04 DIAGNOSIS — B349 Viral infection, unspecified: Secondary | ICD-10-CM | POA: Diagnosis not present

## 2016-03-04 DIAGNOSIS — R509 Fever, unspecified: Secondary | ICD-10-CM | POA: Diagnosis not present

## 2016-03-04 DIAGNOSIS — J111 Influenza due to unidentified influenza virus with other respiratory manifestations: Secondary | ICD-10-CM | POA: Diagnosis not present

## 2016-04-11 NOTE — Pre-Procedure Instructions (Signed)
Michael Harrison  04/11/2016      [id number] [place], [place] - [address] [place] [address] Phone: [phone] Fax: [phone]   [facility] [place], [place] - [address] [place] [address] Phone: [phone] Fax: [phone]    Your procedure is scheduled on 04-20-2017   Wednesday .  Report to [facility] Admitting at 6:30 A.M.  Call this number if you have problems the morning of surgery:  [phone]   Remember:  Do not eat food or drink liquids after midnight.   Take these medicines the morning of surgery with A SIP OF WATER Flonase nasal spray if needed   STOP ASPIRIN,ANTIINFLAMATORIES (IBUPROFEN,ALEVE,MOTRIN,ADVIL,GOODY'S POWDERS),HERBAL SUPPLEMENTS,FISH OIL,AND VITAMINS 5-7 DAYS PRIOR TO SURGERY   Do not wear jewelry,  Do not wear lotions, powders, or perfumes, or deoderant.  Do not shave 48 hours prior to surgery.  Men may shave face and neck.  Do not bring valuables to the hospital.  [facility] is not responsible for any belongings or valuables.  Contacts, dentures or bridgework may not be worn into surgery.  Leave your suitcase in the car.  After surgery it may be brought to your room.  For patients admitted to the hospital, discharge time will be determined by your treatment team.  Patients discharged the day of surgery will not be allowed to drive home.    Special Instructions: [place] - Preparing for Surgery  Before surgery, you can play an important role.  Because skin is not sterile, your skin needs to be as free of germs as possible.  You can reduce the number of germs on you skin by washing with CHG (chlorahexidine gluconate) soap before surgery.  CHG is an antiseptic cleaner which kills germs and bonds with the skin to continue killing germs even after washing.  Please DO NOT use if you have an allergy to CHG or antibacterial soaps.  If your skin becomes  reddened/irritated stop using the CHG and inform your nurse when you arrive at Short Stay.  Do not shave (including legs and underarms) for at least 48 hours prior to the first CHG shower.  You may shave your face.  Please follow these instructions carefully:   1.  Shower with CHG Soap the night before surgery and the   morning of Surgery.  2.  If you choose to wash your hair, wash your hair first as usual with your normal shampoo.  3.  After you shampoo, rinse your hair and body thoroughly to remove the  Shampoo.  4.  Use CHG as you would any other liquid soap.  You can apply chg directly  to the skin and wash gently with scrungie or a clean washcloth.  5.  Apply the CHG Soap to your body ONLY FROM THE NECK DOWN.   Do not use on open wounds or open sores.  Avoid contact with your eyes,  ears, mouth and genitals (private parts).  Wash genitals (private parts) with your normal soap.  6.  Wash thoroughly, paying special attention to the area where your surgery will be performed.  7.  Thoroughly rinse your body with warm water from the neck down.  8.  DO NOT shower/wash with your normal soap after using and rinsing o  the CHG Soap.  9.  Pat yourself dry with a clean towel.            10.  Wear clean pajamas.            11.  Place clean sheets on your bed the night of your first shower and do not sleep with pets.  Day of Surgery  Do not apply any lotions/deodorants the morning of surgery.  Please wear clean clothes to the hospital/surgery center.   Please read over the following fact sheets that you were given. Coughing and Deep Breathing and Surgical Site Infection Prevention

## 2016-04-12 ENCOUNTER — Encounter (HOSPITAL_COMMUNITY): Payer: Self-pay

## 2016-04-12 ENCOUNTER — Encounter (HOSPITAL_COMMUNITY)
Admission: RE | Admit: 2016-04-12 | Discharge: 2016-04-12 | Disposition: A | Discharge status: Home / Self Care | Payer: BLUE CROSS/BLUE SHIELD | Source: Ambulatory Visit | Attending: Surgery | Admitting: Surgery

## 2016-04-12 DIAGNOSIS — Z01812 Encounter for preprocedural laboratory examination: Secondary | ICD-10-CM | POA: Diagnosis not present

## 2016-04-12 DIAGNOSIS — Z01810 Encounter for preprocedural cardiovascular examination: Secondary | ICD-10-CM | POA: Diagnosis not present

## 2016-04-12 HISTORY — DX: Dyspnea, unspecified: R06.00

## 2016-04-12 HISTORY — DX: Unspecified osteoarthritis, unspecified site: M19.90

## 2016-04-12 LAB — BASIC METABOLIC PANEL
ANION GAP: 9 (ref 5–15)
BUN: 13 mg/dL (ref 6–20)
CHLORIDE: 104 mmol/L (ref 101–111)
CO2: 27 mmol/L (ref 22–32)
Calcium: 9.1 mg/dL (ref 8.9–10.3)
Creatinine, Ser: 0.85 mg/dL (ref 0.61–1.24)
GFR calc Af Amer: >60 mL/min (ref >60)
Glucose, Bld: 97 mg/dL (ref 65–99)
POTASSIUM: 4.3 mmol/L (ref 3.5–5.1)
Sodium: 140 mmol/L (ref 135–145)

## 2016-04-12 LAB — CBC
HCT: 41.4 % (ref 39.0–52.0)
HEMOGLOBIN: 13.2 g/dL (ref 13.0–17.0)
MCH: 26.6 pg (ref 26.0–34.0)
MCHC: 31.9 g/dL (ref 30.0–36.0)
MCV: 83.3 fL (ref 78.0–100.0)
Platelets: 344 10*3/uL (ref 150–400)
RBC: 4.97 MIL/uL (ref 4.22–5.81)
RDW: 15 % (ref 11.5–15.5)
WBC: 8.8 10*3/uL (ref 4.0–10.5)

## 2016-04-19 MED ORDER — DEXTROSE 5 % IV SOLN
3.0000 g | INTRAVENOUS | Status: AC
  Administered 2016-04-20: 3 g via INTRAVENOUS
  Filled 2016-04-19: qty 3000

## 2016-04-20 ENCOUNTER — Encounter (HOSPITAL_COMMUNITY): Payer: Self-pay | Admitting: *Deleted

## 2016-04-20 ENCOUNTER — Ambulatory Visit (HOSPITAL_COMMUNITY): Payer: BLUE CROSS/BLUE SHIELD | Admitting: Anesthesiology

## 2016-04-20 ENCOUNTER — Observation Stay (HOSPITAL_COMMUNITY)
Admission: RE | Admit: 2016-04-20 | Discharge: 2016-04-21 | Disposition: A | Discharge status: Home / Self Care | Payer: BLUE CROSS/BLUE SHIELD | Source: Ambulatory Visit | Attending: Surgery | Admitting: Surgery

## 2016-04-20 ENCOUNTER — Encounter (HOSPITAL_COMMUNITY)
Admission: RE | Disposition: A | Discharge status: Home / Self Care | Payer: Self-pay | Source: Ambulatory Visit | Attending: Surgery

## 2016-04-20 DIAGNOSIS — M549 Dorsalgia, unspecified: Secondary | ICD-10-CM | POA: Diagnosis not present

## 2016-04-20 DIAGNOSIS — Z888 Allergy status to other drugs, medicaments and biological substances status: Secondary | ICD-10-CM | POA: Diagnosis not present

## 2016-04-20 DIAGNOSIS — K660 Peritoneal adhesions (postprocedural) (postinfection): Secondary | ICD-10-CM | POA: Insufficient documentation

## 2016-04-20 DIAGNOSIS — K430 Incisional hernia with obstruction, without gangrene: Secondary | ICD-10-CM | POA: Diagnosis not present

## 2016-04-20 DIAGNOSIS — Z841 Family history of disorders of kidney and ureter: Secondary | ICD-10-CM | POA: Diagnosis not present

## 2016-04-20 DIAGNOSIS — Z7982 Long term (current) use of aspirin: Secondary | ICD-10-CM | POA: Insufficient documentation

## 2016-04-20 DIAGNOSIS — Z6841 Body Mass Index (BMI) 40.0 and over, adult: Secondary | ICD-10-CM | POA: Diagnosis not present

## 2016-04-20 DIAGNOSIS — Z8371 Family history of colonic polyps: Secondary | ICD-10-CM | POA: Insufficient documentation

## 2016-04-20 DIAGNOSIS — K5790 Diverticulosis of intestine, part unspecified, without perforation or abscess without bleeding: Secondary | ICD-10-CM | POA: Insufficient documentation

## 2016-04-20 DIAGNOSIS — Z833 Family history of diabetes mellitus: Secondary | ICD-10-CM | POA: Insufficient documentation

## 2016-04-20 DIAGNOSIS — G4733 Obstructive sleep apnea (adult) (pediatric): Secondary | ICD-10-CM | POA: Diagnosis not present

## 2016-04-20 DIAGNOSIS — I10 Essential (primary) hypertension: Secondary | ICD-10-CM | POA: Diagnosis not present

## 2016-04-20 DIAGNOSIS — Z8249 Family history of ischemic heart disease and other diseases of the circulatory system: Secondary | ICD-10-CM | POA: Insufficient documentation

## 2016-04-20 DIAGNOSIS — M1990 Unspecified osteoarthritis, unspecified site: Secondary | ICD-10-CM | POA: Diagnosis not present

## 2016-04-20 DIAGNOSIS — E7800 Pure hypercholesterolemia, unspecified: Secondary | ICD-10-CM | POA: Insufficient documentation

## 2016-04-20 DIAGNOSIS — K436 Other and unspecified ventral hernia with obstruction, without gangrene: Secondary | ICD-10-CM | POA: Diagnosis not present

## 2016-04-20 DIAGNOSIS — E785 Hyperlipidemia, unspecified: Secondary | ICD-10-CM | POA: Diagnosis not present

## 2016-04-20 DIAGNOSIS — K432 Incisional hernia without obstruction or gangrene: Secondary | ICD-10-CM | POA: Diagnosis not present

## 2016-04-20 DIAGNOSIS — Z79899 Other long term (current) drug therapy: Secondary | ICD-10-CM | POA: Diagnosis not present

## 2016-04-20 HISTORY — PX: LAPAROSCOPIC INCISIONAL / UMBILICAL / VENTRAL HERNIA REPAIR: SUR789

## 2016-04-20 HISTORY — PX: VENTRAL HERNIA REPAIR: SHX424

## 2016-04-20 HISTORY — PX: INSERTION OF MESH: SHX5868

## 2016-04-20 HISTORY — DX: Migraine, unspecified, not intractable, without status migrainosus: G43.909

## 2016-04-20 LAB — CBC
HCT: 40.4 % (ref 39.0–52.0)
HEMOGLOBIN: 13.1 g/dL (ref 13.0–17.0)
MCH: 26.8 pg (ref 26.0–34.0)
MCHC: 32.4 g/dL (ref 30.0–36.0)
MCV: 82.6 fL (ref 78.0–100.0)
Platelets: 347 10*3/uL (ref 150–400)
RBC: 4.89 MIL/uL (ref 4.22–5.81)
RDW: 14.8 % (ref 11.5–15.5)
WBC: 16.3 10*3/uL — ABNORMAL HIGH (ref 4.0–10.5)

## 2016-04-20 LAB — CREATININE, SERUM
Creatinine, Ser: 0.88 mg/dL (ref 0.61–1.24)
GFR calc Af Amer: >60 mL/min (ref >60)
GFR calc non Af Amer: >60 mL/min (ref >60)

## 2016-04-20 SURGERY — REPAIR, HERNIA, VENTRAL, LAPAROSCOPIC
Anesthesia: General | Site: Abdomen

## 2016-04-20 MED ORDER — DIPHENHYDRAMINE HCL 25 MG PO CAPS: 25.0000 mg | ORAL_CAPSULE | Freq: Four times a day (QID) | ORAL | Status: DC | PRN

## 2016-04-20 MED ORDER — MIDAZOLAM HCL 2 MG/2ML IJ SOLN
INTRAMUSCULAR | Status: AC
  Filled 2016-04-20: qty 2

## 2016-04-20 MED ORDER — DEXAMETHASONE SODIUM PHOSPHATE 10 MG/ML IJ SOLN
INTRAMUSCULAR | Status: AC
  Filled 2016-04-20: qty 1

## 2016-04-20 MED ORDER — ROCURONIUM BROMIDE 50 MG/5ML IV SOSY
PREFILLED_SYRINGE | INTRAVENOUS | Status: AC
  Filled 2016-04-20: qty 5

## 2016-04-20 MED ORDER — ONDANSETRON 4 MG PO TBDP: 4.0000 mg | ORAL_TABLET | Freq: Four times a day (QID) | ORAL | Status: DC | PRN

## 2016-04-20 MED ORDER — BUPIVACAINE HCL (PF) 0.25 % IJ SOLN
INTRAMUSCULAR | Status: AC
  Filled 2016-04-20: qty 30

## 2016-04-20 MED ORDER — DEXAMETHASONE SODIUM PHOSPHATE 10 MG/ML IJ SOLN
INTRAMUSCULAR | Status: DC | PRN
  Administered 2016-04-20: 5 mg via INTRAVENOUS

## 2016-04-20 MED ORDER — ROCURONIUM 10MG/ML (10ML) SYRINGE FOR MEDFUSION PUMP - OPTIME
INTRAVENOUS | Status: DC | PRN
  Administered 2016-04-20 (×2): 10 mg via INTRAVENOUS
  Administered 2016-04-20: 20 mg via INTRAVENOUS
  Administered 2016-04-20: 10 mg via INTRAVENOUS
  Administered 2016-04-20: 50 mg via INTRAVENOUS

## 2016-04-20 MED ORDER — SUGAMMADEX SODIUM 500 MG/5ML IV SOLN
INTRAVENOUS | Status: DC | PRN
  Administered 2016-04-20: 300 mg via INTRAVENOUS

## 2016-04-20 MED ORDER — PROPOFOL 10 MG/ML IV BOLUS
INTRAVENOUS | Status: AC
  Filled 2016-04-20: qty 20

## 2016-04-20 MED ORDER — HYDROMORPHONE HCL 1 MG/ML IJ SOLN: 1.0000 mg | INTRAMUSCULAR | Status: DC | PRN

## 2016-04-20 MED ORDER — FENTANYL CITRATE (PF) 100 MCG/2ML IJ SOLN
INTRAMUSCULAR | Status: DC | PRN
  Administered 2016-04-20: 100 ug via INTRAVENOUS
  Administered 2016-04-20 (×3): 50 ug via INTRAVENOUS
  Administered 2016-04-20: 100 ug via INTRAVENOUS

## 2016-04-20 MED ORDER — ENOXAPARIN SODIUM 40 MG/0.4ML ~~LOC~~ SOLN
40.0000 mg | SUBCUTANEOUS | Status: DC
Start: 2016-04-21 — End: 2016-04-21
  Administered 2016-04-21: 40 mg via SUBCUTANEOUS
  Filled 2016-04-20: qty 0.4

## 2016-04-20 MED ORDER — POTASSIUM CHLORIDE IN NACL 20-0.9 MEQ/L-% IV SOLN
INTRAVENOUS | Status: DC
  Administered 2016-04-20: 75 mL/h via INTRAVENOUS
  Filled 2016-04-20: qty 1000

## 2016-04-20 MED ORDER — DIPHENHYDRAMINE HCL 50 MG/ML IJ SOLN: 25.0000 mg | Freq: Four times a day (QID) | INTRAMUSCULAR | Status: DC | PRN

## 2016-04-20 MED ORDER — PROMETHAZINE HCL 25 MG/ML IJ SOLN: 6.2500 mg | INTRAMUSCULAR | Status: DC | PRN

## 2016-04-20 MED ORDER — KETOROLAC TROMETHAMINE 30 MG/ML IJ SOLN: 30.0000 mg | Freq: Four times a day (QID) | INTRAMUSCULAR | Status: DC | PRN

## 2016-04-20 MED ORDER — KETOROLAC TROMETHAMINE 30 MG/ML IJ SOLN
INTRAMUSCULAR | Status: AC
  Filled 2016-04-20: qty 1

## 2016-04-20 MED ORDER — SUCCINYLCHOLINE CHLORIDE 200 MG/10ML IV SOSY
PREFILLED_SYRINGE | INTRAVENOUS | Status: AC
  Filled 2016-04-20: qty 10

## 2016-04-20 MED ORDER — LIDOCAINE HCL (CARDIAC) 20 MG/ML IV SOLN
INTRAVENOUS | Status: DC | PRN
  Administered 2016-04-20: 100 mg via INTRATRACHEAL

## 2016-04-20 MED ORDER — ONDANSETRON HCL 4 MG/2ML IJ SOLN
INTRAMUSCULAR | Status: DC | PRN
  Administered 2016-04-20: 4 mg via INTRAVENOUS

## 2016-04-20 MED ORDER — MIDAZOLAM HCL 2 MG/2ML IJ SOLN: 0.5000 mg | Freq: Once | INTRAMUSCULAR | Status: DC | PRN

## 2016-04-20 MED ORDER — HYDROMORPHONE HCL 1 MG/ML IJ SOLN
INTRAMUSCULAR | Status: AC
  Filled 2016-04-20: qty 1

## 2016-04-20 MED ORDER — ACETAMINOPHEN 650 MG RE SUPP: 650.0000 mg | Freq: Four times a day (QID) | RECTAL | Status: DC | PRN

## 2016-04-20 MED ORDER — HYDROMORPHONE HCL 1 MG/ML IJ SOLN
INTRAMUSCULAR | Status: AC
  Filled 2016-04-20: qty 0.5

## 2016-04-20 MED ORDER — METHOCARBAMOL 500 MG PO TABS: 500.0000 mg | ORAL_TABLET | Freq: Four times a day (QID) | ORAL | Status: DC | PRN

## 2016-04-20 MED ORDER — PROPOFOL 10 MG/ML IV BOLUS
INTRAVENOUS | Status: DC | PRN
  Administered 2016-04-20: 200 mg via INTRAVENOUS

## 2016-04-20 MED ORDER — ACETAMINOPHEN 325 MG PO TABS: 650.0000 mg | ORAL_TABLET | Freq: Four times a day (QID) | ORAL | Status: DC | PRN

## 2016-04-20 MED ORDER — FENTANYL CITRATE (PF) 250 MCG/5ML IJ SOLN
INTRAMUSCULAR | Status: AC
  Filled 2016-04-20: qty 5

## 2016-04-20 MED ORDER — OXYCODONE HCL 5 MG PO TABS
5.0000 mg | ORAL_TABLET | ORAL | Status: DC | PRN
  Administered 2016-04-21: 10 mg via ORAL
  Filled 2016-04-20: qty 2

## 2016-04-20 MED ORDER — CEFAZOLIN SODIUM-DEXTROSE 2-4 GM/100ML-% IV SOLN
2.0000 g | Freq: Three times a day (TID) | INTRAVENOUS | Status: AC
  Administered 2016-04-20: 2 g via INTRAVENOUS
  Filled 2016-04-20: qty 100

## 2016-04-20 MED ORDER — ONDANSETRON HCL 4 MG/2ML IJ SOLN
INTRAMUSCULAR | Status: AC
  Filled 2016-04-20: qty 2

## 2016-04-20 MED ORDER — HYDROMORPHONE HCL 1 MG/ML IJ SOLN
0.2500 mg | INTRAMUSCULAR | Status: DC | PRN
  Administered 2016-04-20 (×3): 0.5 mg via INTRAVENOUS

## 2016-04-20 MED ORDER — BUPIVACAINE HCL (PF) 0.25 % IJ SOLN
INTRAMUSCULAR | Status: DC | PRN
  Administered 2016-04-20: 3 mL

## 2016-04-20 MED ORDER — MIDAZOLAM HCL 2 MG/2ML IJ SOLN
INTRAMUSCULAR | Status: DC | PRN
  Administered 2016-04-20: 2 mg via INTRAVENOUS

## 2016-04-20 MED ORDER — LIDOCAINE 2% (20 MG/ML) 5 ML SYRINGE
INTRAMUSCULAR | Status: AC
  Filled 2016-04-20: qty 5

## 2016-04-20 MED ORDER — 0.9 % SODIUM CHLORIDE (POUR BTL) OPTIME
TOPICAL | Status: DC | PRN
  Administered 2016-04-20: 1000 mL

## 2016-04-20 MED ORDER — ATORVASTATIN CALCIUM 40 MG PO TABS
40.0000 mg | ORAL_TABLET | Freq: Every day | ORAL | Status: DC
  Administered 2016-04-20 – 2016-04-21 (×2): 40 mg via ORAL
  Filled 2016-04-20 (×2): qty 1

## 2016-04-20 MED ORDER — ONDANSETRON HCL 4 MG/2ML IJ SOLN: 4.0000 mg | Freq: Four times a day (QID) | INTRAMUSCULAR | Status: DC | PRN

## 2016-04-20 MED ORDER — MEPERIDINE HCL 25 MG/ML IJ SOLN: 6.2500 mg | INTRAMUSCULAR | Status: DC | PRN

## 2016-04-20 MED ORDER — KETOROLAC TROMETHAMINE 30 MG/ML IJ SOLN
30.0000 mg | Freq: Four times a day (QID) | INTRAMUSCULAR | Status: DC
  Administered 2016-04-20 – 2016-04-21 (×4): 30 mg via INTRAVENOUS
  Filled 2016-04-20 (×3): qty 1

## 2016-04-20 MED ORDER — SUGAMMADEX SODIUM 500 MG/5ML IV SOLN
INTRAVENOUS | Status: AC
  Filled 2016-04-20: qty 5

## 2016-04-20 MED ORDER — SUCCINYLCHOLINE CHLORIDE 20 MG/ML IJ SOLN
INTRAMUSCULAR | Status: DC | PRN
  Administered 2016-04-20: 200 mg via INTRAVENOUS

## 2016-04-20 MED ORDER — LACTATED RINGERS IV SOLN
INTRAVENOUS | Status: DC | PRN
  Administered 2016-04-20 (×2): via INTRAVENOUS

## 2016-04-20 SURGICAL SUPPLY — 51 items
ADH SKN CLS LQ APL DERMABOND (GAUZE/BANDAGES/DRESSINGS) ×2
APL SKNCLS STERI-STRIP NONHPOA (GAUZE/BANDAGES/DRESSINGS) ×1
APPLIER CLIP 5 13 M/L LIGAMAX5 (MISCELLANEOUS)
APR CLP MED LRG 5 ANG JAW (MISCELLANEOUS)
BENZOIN TINCTURE PRP APPL 2/3 (GAUZE/BANDAGES/DRESSINGS) ×2 IMPLANT
BINDER ABDOMINAL 12 ML 46-62 (SOFTGOODS) ×2 IMPLANT
BLADE CLIPPER SURG (BLADE) ×1 IMPLANT
CANISTER SUCT 3000ML PPV (MISCELLANEOUS) ×1 IMPLANT
CHLORAPREP W/TINT 26ML (MISCELLANEOUS) ×2 IMPLANT
CLIP APPLIE 5 13 M/L LIGAMAX5 (MISCELLANEOUS) IMPLANT
COVER SURGICAL LIGHT HANDLE (MISCELLANEOUS) ×2 IMPLANT
DERMABOND ADHESIVE PROPEN (GAUZE/BANDAGES/DRESSINGS) ×2
DERMABOND ADVANCED .7 DNX6 (GAUZE/BANDAGES/DRESSINGS) IMPLANT
DEVICE SECURE STRAP 25 ABSORB (INSTRUMENTS) ×3 IMPLANT
DEVICE TROCAR PUNCTURE CLOSURE (ENDOMECHANICALS) ×2 IMPLANT
DRSG TEGADERM 2-3/8X2-3/4 SM (GAUZE/BANDAGES/DRESSINGS) IMPLANT
ELECT REM PT RETURN 9FT ADLT (ELECTROSURGICAL) ×2
ELECTRODE REM PT RTRN 9FT ADLT (ELECTROSURGICAL) ×1 IMPLANT
FILTER SMOKE EVAC LAPAROSHD (FILTER) IMPLANT
GAUZE SPONGE 2X2 8PLY STRL LF (GAUZE/BANDAGES/DRESSINGS) ×1 IMPLANT
GLOVE BIO SURGEON STRL SZ7 (GLOVE) ×2 IMPLANT
GLOVE BIOGEL PI IND STRL 7.5 (GLOVE) ×1 IMPLANT
GLOVE BIOGEL PI INDICATOR 7.5 (GLOVE) ×1
GOWN STRL REUS W/ TWL LRG LVL3 (GOWN DISPOSABLE) ×3 IMPLANT
GOWN STRL REUS W/TWL LRG LVL3 (GOWN DISPOSABLE) ×6
KIT BASIN OR (CUSTOM PROCEDURE TRAY) ×2 IMPLANT
KIT ROOM TURNOVER OR (KITS) ×2 IMPLANT
MARKER SKIN DUAL TIP RULER LAB (MISCELLANEOUS) ×2 IMPLANT
MESH VENTRALIGHT ST 7X9N (Mesh General) ×1 IMPLANT
NDL SPNL 22GX3.5 QUINCKE BK (NEEDLE) ×1 IMPLANT
NEEDLE SPNL 22GX3.5 QUINCKE BK (NEEDLE) ×2 IMPLANT
NS IRRIG 1000ML POUR BTL (IV SOLUTION) ×2 IMPLANT
PAD ARMBOARD 7.5X6 YLW CONV (MISCELLANEOUS) ×4 IMPLANT
SCISSORS LAP 5X35 DISP (ENDOMECHANICALS) IMPLANT
SET IRRIG TUBING LAPAROSCOPIC (IRRIGATION / IRRIGATOR) IMPLANT
SHEARS HARMONIC ACE PLUS 36CM (ENDOMECHANICALS) ×1 IMPLANT
SLEEVE ENDOPATH XCEL 5M (ENDOMECHANICALS) ×2 IMPLANT
SPONGE GAUZE 2X2 STER 10/PKG (GAUZE/BANDAGES/DRESSINGS) ×1
STRIP CLOSURE SKIN 1/2X4 (GAUZE/BANDAGES/DRESSINGS) ×2 IMPLANT
SUT MNCRL AB 4-0 PS2 18 (SUTURE) ×2 IMPLANT
SUT NOVA NAB GS-21 0 18 T12 DT (SUTURE) ×3 IMPLANT
TOWEL OR 17X24 6PK STRL BLUE (TOWEL DISPOSABLE) ×2 IMPLANT
TOWEL OR 17X26 10 PK STRL BLUE (TOWEL DISPOSABLE) ×2 IMPLANT
TRAY FOLEY W/METER SILVER 14FR (SET/KITS/TRAYS/PACK) ×1 IMPLANT
TRAY LAPAROSCOPIC MC (CUSTOM PROCEDURE TRAY) ×2 IMPLANT
TROCAR 12M 150ML BLUNT (TROCAR) ×1 IMPLANT
TROCAR 5M 150ML BLDLS (TROCAR) ×2 IMPLANT
TROCAR XCEL BLUNT TIP 100MML (ENDOMECHANICALS) IMPLANT
TROCAR XCEL NON-BLD 11X100MML (ENDOMECHANICALS) ×2 IMPLANT
TROCAR XCEL NON-BLD 5MMX100MML (ENDOMECHANICALS) ×2 IMPLANT
TUBING INSUFFLATION (TUBING) ×2 IMPLANT

## 2016-04-20 NOTE — Op Note (Signed)
Laparoscopic Ventral Hernia Repair Procedure Note  Indications: Symptomatic recurrent periumbilical ventral hernia  Pre-operative Diagnosis: Recurrent ventral hernia  Post-operative Diagnosis: same  Surgeon: Wynona Luna.   Assistants: none  Anesthesia: General endotracheal anesthesia  ASA Class: 2  Procedure Details  The patient was seen in the Holding Room. The risks, benefits, complications, treatment options, and expected outcomes were discussed with the patient. The possibilities of reaction to medication, pulmonary aspiration, perforation of viscus, bleeding, recurrent infection, the need for additional procedures, failure to diagnose a condition, and creating a complication requiring transfusion or operation were discussed with the patient. The patient concurred with the proposed plan, giving informed consent.  The site of surgery properly noted/marked. The patient was taken to the operating room, identified as Michael Harrison and the procedure verified as laparoscopic ventral hernia repair with mesh. A Time Out was held and the above information confirmed.    The patient was placed supine.  After establishing general anesthesia, a Foley catheter was placed.  The abdomen was prepped with Chloraprep and draped in standard fashion.  A 5 mm Optiview was used the cannulate the peritoneal cavity in the left upper quadrant below the costal margin.  Pneumoperitoneum was obtained by insufflating CO2, maintaining a maximum pressure of 15 mmHg.  The 5 mm 30-degree laparoscope was inserted.  There were significant omental adhesions to the anterior abdominal wall in and around the hernia defect.  An 11-mm port was placed in the left anterior axillary line at the level of the umbilicus.  Another 5-mm port was placed in the left lower quadrant.  Harmonic scalpel and gentle traction were used to dissect the omental adhesions away from the anterior abdominal wall.  There was an enormous amount of  omentum in the hernia sac, but we were able to completely reduce the omentum. We cleared the entire abdominal wall and were able to visualize two fascial defects located around the umbilicus.  The old mesh is visualized between the defects. We used a spinal needle to identify the extent of the hernia defects.  This covered an area of about 10 x 12 cms.  We selected a 18 x 22 cm piece of Bard Ventralight mesh.  We placed eight stay sutures of 0 Novofil around the edges of the mesh.  The mesh was then rolled up and inserted through the 11 mm port site.  The mesh was then unrolled.  The stay sutures were then pulled up through small stab incisions using the Endo-close device.  This deployed the mesh widely over the fascial defects.  The stay sutures were then tied down.  The Secure Strap device was then used to tack down the edges of the mesh at 1 cm intervals circumferentially. We placed an additional 5 mm port site on the right to allow proper placement of the tacks. We placed a few tacks inside the outer ring of tacks.  We inspected for hemostasis.  The fascial defect at the 11 mm port site was closed with a 0 Vicryl using the Endoclose device.  Pneumoperitoneum was then released as we removed the remainder of the trocars.  The port sites were closed with 4-0 Monocryl.  All of the incisions and stay suture sites were then sealed with Dermabond.  An abdominal binder was placed around the patient's abdomen.  The patient was extubated and brought to the recovery room in stable condition.  All sponge, instrument, and needle counts were correct prior to closure and at the conclusion of  the case.   Findings: Two defects around umbilicus - 4 cm/ 2 cm  Estimated Blood Loss:  Minimal         Complications:  None; patient tolerated the procedure well.         Disposition: PACU - hemodynamically stable.         Condition: stable  Wilmon Arms. Corliss Skains, MD, [facility]  General/ Trauma  Surgery  04/20/2016 10:36 AM

## 2016-04-20 NOTE — Anesthesia Preprocedure Evaluation (Signed)
Anesthesia Evaluation  Patient identified by MRN, date of birth, ID band Patient awake    Reviewed: Allergy & Precautions, NPO status , Patient's Chart, lab work & pertinent test results  History of Anesthesia Complications Negative for: history of anesthetic complications  Airway Mallampati: II  TM Distance: >3 FB Neck ROM: Full    Dental  (+) Dental Advisory Given   Pulmonary shortness of breath, sleep apnea and Continuous Positive Airway Pressure Ventilation ,    breath sounds clear to auscultation       Cardiovascular hypertension (borderline, no meds),  Rhythm:Regular Rate:Normal     Neuro/Psych negative neurological ROS     GI/Hepatic negative GI ROS, Neg liver ROS,   Endo/Other  Morbid obesity  Renal/GU      Musculoskeletal  (+) Arthritis ,   Abdominal (+) + obese,   Peds  Hematology negative hematology ROS (+)   Anesthesia Other Findings   Reproductive/Obstetrics                             Anesthesia Physical Anesthesia Plan  ASA: III  Anesthesia Plan: General   Post-op Pain Management:    Induction: Intravenous  Airway Management Planned: Oral ETT and Video Laryngoscope Planned  Additional Equipment:   Intra-op Plan:   Post-operative Plan: Extubation in OR  Informed Consent: I have reviewed the patients History and Physical, chart, labs and discussed the procedure including the risks, benefits and alternatives for the proposed anesthesia with the patient or authorized representative who has indicated his/her understanding and acceptance.   Dental advisory given  Plan Discussed with: CRNA and Surgeon  Anesthesia Plan Comments: (Plan routine monitors, GETA )        Anesthesia Quick Evaluation

## 2016-04-20 NOTE — Anesthesia Procedure Notes (Addendum)
Procedure Name: Intubation Date/Time: 04/20/2016 8:44 AM Performed by: Izola Price Pre-anesthesia Checklist: Patient identified, Emergency Drugs available, Suction available, Patient being monitored and Timeout performed Patient Re-evaluated:Patient Re-evaluated prior to inductionOxygen Delivery Method: Circle system utilized Preoxygenation: Pre-oxygenation with 100% oxygen Intubation Type: IV induction Laryngoscope Size: Glidescope and 4 Grade View: Grade I Tube type: Oral Tube size: 7.5 mm Number of attempts: 1 Airway Equipment and Method: Video-laryngoscopy Placement Confirmation: positive ETCO2,  CO2 detector,  breath sounds checked- equal and bilateral and ETT inserted through vocal cords under direct vision Secured at: 24 cm Tube secured with: Tape Dental Injury: Teeth and Oropharynx as per pre-operative assessment

## 2016-04-20 NOTE — Transfer of Care (Signed)
Immediate Anesthesia Transfer of Care Note  Patient: Michael Harrison  Procedure(s) Performed: Procedure(s): LAPAROSCOPIC VENTRAL HERNIA REPAIR WITH MESH (N/A) INSERTION OF MESH (N/A)  Patient Location: PACU  Anesthesia Type:General  Level of Consciousness: awake, alert  and oriented  Airway & Oxygen Therapy: Patient Spontanous Breathing and Patient connected to nasal cannula oxygen  Post-op Assessment: Report given to RN, Post -op Vital signs reviewed and stable and Patient moving all extremities X 4  Post vital signs: Reviewed and stable  Last Vitals:  Vitals:   04/20/16 0719 04/20/16 1050  BP: 137/69 136/78  Pulse: 83 94  Resp: 20 13  Temp: 36.2 C 36.3 C    Last Pain:  Vitals:   04/20/16 0719  TempSrc: Oral         Complications: No apparent anesthesia complications

## 2016-04-20 NOTE — H&P (Signed)
History of Present Illness  The patient is a 46 year old male who presents with an abdominal wall hernia. This is a 46 year old male with severe morbid obesity who is status post ventral hernia repair with mesh on 10/03/07. This was a primary ventral hernia that was repaired with large ventralex mesh and secured with 10 Prolene sutures. Unfortunately the patient went back to work the day after surgery. He did feel a pop on postop day #2 which may have been a suture that broke. Over the last several years he is gone up over 60 pounds and his weight and was as high as 420 pounds. He has been considering bariatric surgery but has not shown any signs of being able to lose any weight on his own. He denies any obstructive symptoms. He was examined in January 2015, but was considering bariatric surgery. We saw the patient in December 2015 and recommended surgery at that time. However the patient chose not to have surgery because of financial reasons. The hernia has become larger and more uncomfortable. He denies any obstructive symptoms. He would like to have it repaired at this time.  Patient has obstructive sleep apnea and uses a CPAP at night.    Problem List/Past Medical  RECURRENT VENTRAL HERNIA WITH INCARCERATION (K43.0)  Past Surgical History  Open Inguinal Hernia Surgery Right.  Diagnostic Studies History  Colonoscopy 1-5 years ago  Allergies  Iohexol *DIAGNOSTIC PRODUCTS*  Medication History  Aspirin (  Tablet, Oral daily) Active. Atorvastatin Calcium (  Tablet, Oral) Active. Naproxen (  Tablet, Oral) Active. Medications Reconciled  Social History  Caffeine use Tea. Tobacco use Never smoker.  Family History  Colon Polyps Mother. Diabetes Mellitus Mother. Heart disease in male family member before age 37 Kidney Disease Mother.  Other Problems Back Pain Diverticulosis Hypercholesterolemia Sleep Apnea Umbilical Hernia  Repair    Vitals  Weight: 401 lb Height: 70in Body Surface Area: 2.81 m Body Mass Index: 57.54 kg/m  Temp.: 68F  Pulse: 82 (Regular)  BP: 122/82 (Sitting, Left Arm, Standard)      Physical Exam   The physical exam findings are as follows: Note:WDWN in NAD HEENT: EOMI, sclera anicteric Neck: No masses, no thyromegaly Lungs: CTA bilaterally; normal respiratory effort CV: Regular rate and rhythm; no murmurs Abd: +bowel sounds, obese, healed midline incision above umbilicus; protruding bulge the left of the upper end of the incision; not reducible. Hernia sac measures about 8 cm across Ext: Well-perfused; no edema Skin: Warm, dry; no sign of jaundice    Assessment & Plan   RECURRENT VENTRAL HERNIA WITH INCARCERATION (K43.0)  Current Plans Schedule for Surgery - Laparoscopic ventral hernia repair with mesh. The surgical procedure has been discussed with the patient. Potential risks, benefits, alternative treatments, and expected outcomes have been explained. All of the patient's questions at this time have been answered. The likelihood of reaching the patient's treatment goal is good. The patient understand the proposed surgical procedure and wishes to proceed. Note:I explained to him that his recurrence risk remains elevated because of his morbid obesity. Also, at the last surgery he did not follow postoperative instructions and went to work the day after surgery. This likely contributed to his recurrence. The patient assures me that he will follow instructions this time. He has lost about 10 pounds over the last month by switching to a low carb diet. I encouraged him to continue this.   Wilmon Arms. Corliss Skains, MD, [facility] Surgery  General/ Trauma Surgery  04/20/2016 7:58 AM

## 2016-04-20 NOTE — Anesthesia Postprocedure Evaluation (Signed)
Anesthesia Post Note  Patient: Everest E Friese  Procedure(s) Performed: Procedure(s) (LRB): LAPAROSCOPIC VENTRAL HERNIA REPAIR WITH MESH (N/A) INSERTION OF MESH (N/A)  Patient location during evaluation: PACU Anesthesia Type: General Level of consciousness: awake and alert, oriented and patient cooperative Pain management: pain level controlled Vital Signs Assessment: post-procedure vital signs reviewed and stable Respiratory status: spontaneous breathing, nonlabored ventilation and respiratory function stable Cardiovascular status: blood pressure returned to baseline and stable Postop Assessment: no signs of nausea or vomiting Anesthetic complications: no       Last Vitals:  Vitals:   04/20/16 1205 04/20/16 1213  BP:  (!) 136/96  Pulse: 81 87  Resp: 13 13  Temp:  36.6 C    Last Pain:  Vitals:   04/20/16 1213  TempSrc: Oral  PainSc:                  Zuriel Roskos,E. Jakobi Thetford

## 2016-04-21 ENCOUNTER — Encounter (HOSPITAL_COMMUNITY): Payer: Self-pay | Admitting: Surgery

## 2016-04-21 DIAGNOSIS — Z841 Family history of disorders of kidney and ureter: Secondary | ICD-10-CM | POA: Diagnosis not present

## 2016-04-21 DIAGNOSIS — I10 Essential (primary) hypertension: Secondary | ICD-10-CM | POA: Diagnosis not present

## 2016-04-21 DIAGNOSIS — K660 Peritoneal adhesions (postprocedural) (postinfection): Secondary | ICD-10-CM | POA: Diagnosis not present

## 2016-04-21 DIAGNOSIS — Z8249 Family history of ischemic heart disease and other diseases of the circulatory system: Secondary | ICD-10-CM | POA: Diagnosis not present

## 2016-04-21 DIAGNOSIS — G4733 Obstructive sleep apnea (adult) (pediatric): Secondary | ICD-10-CM | POA: Diagnosis not present

## 2016-04-21 DIAGNOSIS — Z79899 Other long term (current) drug therapy: Secondary | ICD-10-CM | POA: Diagnosis not present

## 2016-04-21 DIAGNOSIS — K430 Incisional hernia with obstruction, without gangrene: Secondary | ICD-10-CM | POA: Diagnosis not present

## 2016-04-21 DIAGNOSIS — E7800 Pure hypercholesterolemia, unspecified: Secondary | ICD-10-CM | POA: Diagnosis not present

## 2016-04-21 DIAGNOSIS — M549 Dorsalgia, unspecified: Secondary | ICD-10-CM | POA: Diagnosis not present

## 2016-04-21 DIAGNOSIS — Z888 Allergy status to other drugs, medicaments and biological substances status: Secondary | ICD-10-CM | POA: Diagnosis not present

## 2016-04-21 DIAGNOSIS — Z7982 Long term (current) use of aspirin: Secondary | ICD-10-CM | POA: Diagnosis not present

## 2016-04-21 DIAGNOSIS — Z833 Family history of diabetes mellitus: Secondary | ICD-10-CM | POA: Diagnosis not present

## 2016-04-21 DIAGNOSIS — K5790 Diverticulosis of intestine, part unspecified, without perforation or abscess without bleeding: Secondary | ICD-10-CM | POA: Diagnosis not present

## 2016-04-21 DIAGNOSIS — Z6841 Body Mass Index (BMI) 40.0 and over, adult: Secondary | ICD-10-CM | POA: Diagnosis not present

## 2016-04-21 DIAGNOSIS — Z8371 Family history of colonic polyps: Secondary | ICD-10-CM | POA: Diagnosis not present

## 2016-04-21 MED ORDER — OXYCODONE HCL 5 MG PO TABS: 5.0000 mg | ORAL_TABLET | Freq: Four times a day (QID) | ORAL | 0 refills | Status: DC | PRN

## 2016-04-21 NOTE — Discharge Summary (Signed)
Physician Discharge Summary  Patient ID: Michael Harrison MRN: [id number] DOB/AGE: [birth year] 45 y.o.  Admit date: 04/20/2016 Discharge date: 04/21/2016  Admission Diagnoses:  Recurrent ventral incisional hernia  Discharge Diagnoses: same  Active Problems:   Recurrent ventral hernia with incarceration   Discharged Condition: good  Hospital Course: Laparoscopic ventral hernia repair with mesh on 04/21/16.  We kept the patient overnight for pain control and observation because of obstructive sleep apnea.  Pain seems to be well-controlled.  Oxygen saturations fine with CPAP while sleeping.  Ready for discharge.  Consults: None  Significant Diagnostic Studies: none  Treatments: surgery: laparoscopic ventral hernia repair with mesh  Discharge Exam: Blood pressure 134/75, pulse 78, temperature 98.2 F (36.8 C), temperature source Oral, resp. rate 18, height  (1.778 m), weight (!) 184.5 kg (406 lb 12 oz), SpO2 (!) 1 %. General appearance: alert, cooperative and no distress GI: soft; + BS; incisional tenderness Incisions c/d/i with minimal bruising  Disposition: 01-Home or Self Care  Discharge Instructions    Call MD for:  persistant nausea and vomiting    Complete by:  As directed    Call MD for:  redness, tenderness, or signs of infection (pain, swelling, redness, odor or green/yellow discharge around incision site)    Complete by:  As directed    Call MD for:  severe uncontrolled pain    Complete by:  As directed    Call MD for:  temperature >100.4    Complete by:  As directed    Diet general    Complete by:  As directed    Driving Restrictions    Complete by:  As directed    Do not drive while taking pain medications   Increase activity slowly    Complete by:  As directed    May shower / Bathe    Complete by:  As directed      Allergies as of 04/21/2016      Reactions   Iohexol Other (See Comments)   UNSPECIFIED REACTION Desc: Notes indicated PT needs 13 hr  prep "Pt felt strange "      Medication List    TAKE these medications   aspirin EC 81 MG tablet Take 1 tablet (81 mg total) by mouth daily.   atorvastatin 40 MG tablet Commonly known as:  LIPITOR Take 1 tablet (40 mg total) by mouth daily.   fluticasone 50 MCG/ACT nasal spray Commonly known as:  FLONASE Place 2 sprays into the nose daily. What changed:  when to take this  reasons to take this   oxyCODONE 5 MG immediate release tablet Commonly known as:  Oxy IR/ROXICODONE Take 1 tablet (5 mg total) by mouth every 6 (six) hours as needed for moderate pain.      Follow-up Information    Lilliane Sposito K., MD Follow up in 3 week(s).   Specialty:  General Surgery Contact information: [address] [place] [address] [phone]           Signed: Wynona Luna. 04/21/2016, 7:42 AM

## 2016-04-21 NOTE — Discharge Instructions (Signed)
[facility]      [facility], [place] [phone]   ABDOMINAL SURGERY: POST OP INSTRUCTIONS  Always review your discharge instruction sheet given to you by the facility where your surgery was performed.  IF YOU HAVE DISABILITY OR FAMILY LEAVE FORMS, YOU MUST BRING THEM TO THE OFFICE FOR PROCESSING.  PLEASE DO NOT GIVE THEM TO YOUR DOCTOR.  1. A prescription for pain medication may be given to you upon discharge.  Take your pain medication as prescribed, if needed.  If narcotic pain medicine is not needed, then you may take acetaminophen (Tylenol) or ibuprofen (Advil) as needed. 2. Take your usually prescribed medications unless otherwise directed. 3. If you need a refill on your pain medication, please contact your pharmacy. They will contact our office to request authorization.  Prescriptions will not be filled after 5pm or on week-ends. 4. You should follow a light diet the first few days after arrival home, such as soup and crackers, pudding, etc.unless your doctor has advised otherwise. A high-fiber, low fat diet can be resumed as tolerated.   Be sure to include lots of fluids daily. Most patients will experience some swelling and bruising on the chest and neck area.  Ice packs will help.  Swelling and bruising can take several days to resolve 5. Most patients will experience some swelling and bruising in the area of the incisions. Ice pack will help. Swelling and bruising can take several days to resolve..  6. It is common to experience some constipation if taking pain medication after surgery.  Increasing fluid intake and taking a stool softener will usually help or prevent this problem from occurring.  A mild laxative (Milk of Magnesia or Miralax) should be taken according to package directions if there are no bowel movements after 48 hours. 7.   If your surgeon used skin glue on the incision, you may shower in 24 hours.  The glue will flake off over the next 2-3 weeks.  You may wear the  abdominal binder as much as needed for comfort. 8. ACTIVITIES:  You may resume regular (light) daily activities beginning the next day--such as daily self-care, walking, climbing stairs--gradually increasing activities as tolerated.  You may have sexual intercourse when it is comfortable.  Refrain from any heavy lifting or straining until approved by your doctor. a. You may drive when you no longer are taking prescription pain medication, you can comfortably wear a seatbelt, and you can safely maneuver your car and apply brakes b. Return to Work: ___________________________________ 9. You should see your doctor in the office for a follow-up appointment approximately two weeks after your surgery.  Make sure that you call for this appointment within a day or two after you arrive home to insure a convenient appointment time. OTHER INSTRUCTIONS:  _____________________________________________________________ _____________________________________________________________  WHEN TO CALL YOUR DOCTOR: 1. Fever over 101.0 2. Inability to urinate 3. Nausea and/or vomiting 4. Extreme swelling or bruising 5. Continued bleeding from incision. 6. Increased pain, redness, or drainage from the incision. 7. Difficulty swallowing or breathing 8. Muscle cramping or spasms. 9. Numbness or tingling in hands or feet or around lips.  The clinic staff is available to answer your questions during regular business hours.  Please dont hesitate to call and ask to speak to one of the nurses if you have concerns.  For further questions, please visit [email or web address]

## 2016-04-21 NOTE — Progress Notes (Signed)
Patient discharged home accompanied by wife.

## 2016-05-06 ENCOUNTER — Other Ambulatory Visit: Payer: Self-pay | Admitting: Surgery

## 2016-05-06 DIAGNOSIS — K432 Incisional hernia without obstruction or gangrene: Secondary | ICD-10-CM

## 2016-05-06 DIAGNOSIS — K430 Incisional hernia with obstruction, without gangrene: Secondary | ICD-10-CM

## 2016-05-07 DIAGNOSIS — M545 Low back pain: Secondary | ICD-10-CM | POA: Diagnosis not present

## 2016-05-07 DIAGNOSIS — M47816 Spondylosis without myelopathy or radiculopathy, lumbar region: Secondary | ICD-10-CM | POA: Diagnosis not present

## 2016-05-09 ENCOUNTER — Ambulatory Visit
Admission: RE | Admit: 2016-05-09 | Discharge: 2016-05-09 | Disposition: A | Discharge status: Home / Self Care | Payer: BLUE CROSS/BLUE SHIELD | Source: Ambulatory Visit | Attending: Surgery | Admitting: Surgery

## 2016-05-09 DIAGNOSIS — K430 Incisional hernia with obstruction, without gangrene: Secondary | ICD-10-CM

## 2016-05-09 DIAGNOSIS — R109 Unspecified abdominal pain: Secondary | ICD-10-CM | POA: Diagnosis not present

## 2016-05-09 NOTE — Progress Notes (Signed)
Please call the patient and let them know that they do not have a recurrent hernia.  They have a large seroma (fluid collection) that needs to be drained.  Please order Interventional radiology - ultrasound guided drainage of subcutaneous fluid collection.  THanks

## 2016-05-10 ENCOUNTER — Other Ambulatory Visit (HOSPITAL_COMMUNITY): Payer: Self-pay | Admitting: Surgery

## 2016-05-10 DIAGNOSIS — S301XXA Contusion of abdominal wall, initial encounter: Secondary | ICD-10-CM

## 2016-05-12 ENCOUNTER — Other Ambulatory Visit: Payer: Self-pay | Admitting: Student

## 2016-05-12 ENCOUNTER — Other Ambulatory Visit: Payer: Self-pay | Admitting: Radiology

## 2016-05-13 ENCOUNTER — Ambulatory Visit (HOSPITAL_COMMUNITY)
Admission: RE | Admit: 2016-05-13 | Discharge: 2016-05-13 | Disposition: A | Discharge status: Home / Self Care | Payer: BLUE CROSS/BLUE SHIELD | Source: Ambulatory Visit | Attending: Surgery | Admitting: Surgery

## 2016-05-13 ENCOUNTER — Other Ambulatory Visit (HOSPITAL_COMMUNITY): Payer: Self-pay | Admitting: Surgery

## 2016-05-13 DIAGNOSIS — M96843 Postprocedural seroma of a musculoskeletal structure following other procedure: Secondary | ICD-10-CM | POA: Diagnosis not present

## 2016-05-13 DIAGNOSIS — S301XXA Contusion of abdominal wall, initial encounter: Secondary | ICD-10-CM

## 2016-05-13 DIAGNOSIS — L02211 Cutaneous abscess of abdominal wall: Secondary | ICD-10-CM | POA: Diagnosis not present

## 2016-05-13 DIAGNOSIS — Y838 Other surgical procedures as the cause of abnormal reaction of the patient, or of later complication, without mention of misadventure at the time of the procedure: Secondary | ICD-10-CM | POA: Insufficient documentation

## 2016-05-13 LAB — CBC
HEMATOCRIT: 41.1 % (ref 39.0–52.0)
HEMOGLOBIN: 13.2 g/dL (ref 13.0–17.0)
MCH: 26.4 pg (ref 26.0–34.0)
MCHC: 32.1 g/dL (ref 30.0–36.0)
MCV: 82.2 fL (ref 78.0–100.0)
Platelets: 451 10*3/uL — ABNORMAL HIGH (ref 150–400)
RBC: 5 MIL/uL (ref 4.22–5.81)
RDW: 14.4 % (ref 11.5–15.5)
WBC: 8.9 10*3/uL (ref 4.0–10.5)

## 2016-05-13 LAB — PROTIME-INR
INR: 1.05
Prothrombin Time: 13.8 seconds (ref 11.4–15.2)

## 2016-05-13 LAB — APTT: aPTT: 29 seconds (ref 24–36)

## 2016-05-13 MED ORDER — LIDOCAINE HCL 1 % IJ SOLN
INTRAMUSCULAR | Status: AC
  Filled 2016-05-13: qty 20

## 2016-05-13 MED ORDER — SODIUM CHLORIDE 0.9 % IV SOLN: INTRAVENOUS | Status: DC

## 2016-05-13 NOTE — Procedures (Signed)
Interventional Radiology Procedure Note  Procedure: US guided aspiration of abdominal wall fluid collection.  ~220cc of thin, dark brown/red fluid aspirated.  Fluid does not look frankly infected.   Sample sent for culture.  Sample for TG's was deferred bc of the appearance of what looks like seroma/hematoma.   Complications: None  Recommendations:  - Ok to shower tomorrow - Do not submerge for 7 days - Routine wound care   Signed,  Yvone NeuJaime S. Loreta AveWagner, DO

## 2016-05-13 NOTE — Consult Note (Signed)
Chief Complaint: Patient was seen in consultation today for ultrasound-guided aspiration of abdominal wall fluid collection  Referring Physician(s): Tsuei,Matthew  Supervising Physician: Irish Lack  Patient Status: [facility] - Out-pt  History of Present Illness: Michael Harrison is a 46 y.o. male status post repair of a recurrent ventral hernia on 04/11/16. Persistent abdominal pain the patient underwent follow-up CT abdomen and pelvis on 05/09/16 which revealed fluid collection in the subcutaneous tissues anterior to the abdominal wall likely a postoperative seroma measuring 9.7 x 6.1  x 8.9 cm. He presents today for image guided aspiration of the above fluid collection.  Past Medical History:  Diagnosis Date  . Arthritis    "knees" (04/20/2016)  . Dyspnea    with exertion  . Hyperlipidemia   . Hypertension    Borderline  no medication  . Migraine    "often in my teens; yearly now" (04/20/2016)  . Morbid obesity (HCC)   . OSA on CPAP   . Vitamin D deficiency     Past Surgical History:  Procedure Laterality Date  . BREATH TEK H PYLORI N/A 04/22/2013   Procedure: BREATH TEK H PYLORI;  Surgeon: Valarie Merino, MD;  Location: [facility];  Service: General;  Laterality: N/A;  . HERNIA REPAIR    . INSERTION OF MESH N/A 04/20/2016   Procedure: INSERTION OF MESH;  Surgeon: Manus Rudd, MD;  Location: [facility] OR;  Service: General;  Laterality: N/A;  . LAPAROSCOPIC INCISIONAL / UMBILICAL / VENTRAL HERNIA REPAIR  04/20/2016   LAPAROSCOPIC VENTRAL HERNIA REPAIR WITH MESH/notes 04/20/2016  . LIPOMA EXCISION  ~ 1998   chest area  . UMBILICAL HERNIA REPAIR  2009  . VENTRAL HERNIA REPAIR N/A 04/20/2016   Procedure: LAPAROSCOPIC VENTRAL HERNIA REPAIR WITH MESH;  Surgeon: Manus Rudd, MD;  Location: [facility] OR;  Service: General;  Laterality: N/A;    Allergies: Iohexol  Medications: Prior to Admission medications   Medication Sig Start Date End Date Taking? Authorizing Provider  aspirin  EC 81 MG tablet Take 1 tablet (81 mg total) by mouth daily. 07/26/12  Yes Ileana Ladd, MD  atorvastatin (LIPITOR) 40 MG tablet Take 1 tablet (40 mg total) by mouth daily. 01/08/14  Yes Ernestina Penna, MD  cyclobenzaprine (FLEXERIL) 10 MG tablet Take 10 mg by mouth 3 (three) times daily as needed for muscle spasms.   Yes Historical Provider, MD  fluticasone (FLONASE) 50 MCG/ACT nasal spray Place 2 sprays into the nose daily. Patient taking differently: Place 2 sprays into the nose daily as needed for allergies.  04/11/12  Yes Ileana Ladd, MD  naproxen sodium (ALEVE) 220 MG tablet Take 220 mg by mouth daily as needed.   Yes Historical Provider, MD  oxyCODONE (OXY IR/ROXICODONE) 5 MG immediate release tablet Take 1 tablet (5 mg total) by mouth every 6 (six) hours as needed for moderate pain. 04/21/16  Yes Emelia Loron, MD     Family History  Problem Relation Age of Onset  . Diabetes Mother   . Heart disease Mother     Social History   Social History  . Marital status: Married    Spouse name: N/A  . Number of children: N/A  . Years of education: N/A   Social History Main Topics  . Smoking status: Never Smoker  . Smokeless tobacco: Never Used  . Alcohol use Yes     Comment: 04/20/2016 "I quit drinking in 2003; never had problem w/alcohol"  . Drug use: No  .  Sexual activity: Yes   Other Topics Concern  . Not on file   Social History Narrative  . No narrative on file      Review of Systems denies fever, headache, chest pain, dyspnea, cough, nausea, vomiting or abnormal bleeding. He has abdominal and back pain.  Vital Signs: BP (!) 147/88   Pulse 96   Temp 98.4 F (36.9 C)   Resp 18   Ht 5\' 2"  (1.575 m)   Wt (!) 400 lb (181.4 kg)   SpO2 96%   BMI 73.16 kg/m   Physical Exam awake, alert. Chest clear to auscultation bilaterally. Heart with regular rate and rhythm. Abdomen obese, soft, palpable slightly firm soft tissue? fluid collection anterior abdominal wall left  of midline, tender to palpation.  Mallampati Score:     Imaging: Ct Abdomen Pelvis Wo Contrast  Result Date: 05/09/2016 CLINICAL DATA:  History of ventral hernia repair with persistent pain, initial encounter EXAM: CT ABDOMEN AND PELVIS WITHOUT CONTRAST TECHNIQUE: Multidetector CT imaging of the abdomen and pelvis was performed following the standard protocol without IV contrast. COMPARISON:  04/27/2007 FINDINGS: Lower chest: No acute abnormality. Hepatobiliary: No focal liver abnormality is seen. No gallstones, gallbladder wall thickening, or biliary dilatation. Pancreas: Unremarkable. No pancreatic ductal dilatation or surrounding inflammatory changes. Spleen: Normal in size without focal abnormality. Adrenals/Urinary Tract: Adrenal glands are unremarkable. Kidneys are normal, without renal calculi, focal lesion, or hydronephrosis. Bladder is unremarkable. Stomach/Bowel: The appendix is not well visualized although no inflammatory changes are identified to suggest appendicitis. No obstructive changes are seen. Vascular/Lymphatic: Aortic atherosclerosis. No enlarged abdominal or pelvic lymph nodes. Reproductive: Prostate is unremarkable. Other: There are changes consistent with the recent history of ventral hernia repair. There is a fluid attenuation collection in the subcutaneous tissues anterior to the abdominal wall likely representing a postoperative seroma. This measures 9.7 x 6.1 cm in greatest transverse and AP dimensions. It extends for approximately 8.9 cm in craniocaudad projection. No recurrent hernia is identified. Mild haziness of the omental fat is noted anteriorly consistent with the recent surgery. Musculoskeletal: Degenerative changes of lumbar spine are seen. IMPRESSION: Status post ventral hernia repair with postoperative seroma in the subcutaneous tissues as described. No other focal abnormality is noted. Electronically Signed   By: Alcide Clever M.D.   On: 05/09/2016 09:52     Labs:  CBC:  Recent Labs  04/12/16 1450 04/20/16 1253  WBC 8.8 16.3*  HGB 13.2 13.1  HCT 41.4 40.4  PLT 344 347    COAGS: No results for input(s): INR, APTT in the last 8760 hours.  BMP:  Recent Labs  04/12/16 1450 04/20/16 1253  NA 140  --   K 4.3  --   CL 104  --   CO2 27  --   GLUCOSE 97  --   BUN 13  --   CALCIUM 9.1  --   CREATININE 0.85 0.88  GFRNONAA >60 >60  GFRAA >60 >60    LIVER FUNCTION TESTS: No results for input(s): BILITOT, AST, ALT, ALKPHOS, PROT, ALBUMIN in the last 8760 hours.  TUMOR MARKERS: No results for input(s): AFPTM, CEA, CA199, CHROMGRNA in the last 8760 hours.  Assessment and Plan: 46 y.o. male status post repair of a recurrent ventral hernia on 04/11/16. Persistent abdominal pain the patient underwent follow-up CT abdomen and pelvis on 05/09/16 which revealed fluid collection in the subcutaneous tissues anterior to the abdominal wall likely a postoperative seroma measuring 9.7 x 6.1  x 8.9 cm.  He presents today for image guided aspiration of the above fluid collection.Risks and benefits discussed with the patient/spouse including, but not limited to bleeding, infection, damage to adjacent structures or low yield requiring additional tests.All of the patient's questions were answered, patient is agreeable to proceed.Consent signed and in chart.     Thank you for this interesting consult.  I greatly enjoyed meeting Michael Harrison and look forward to participating in their care.  A copy of this report was sent to the requesting provider on this date.  Electronically Signed: D. Jeananne RamaKevin Allred 05/13/2016, 10:05 AM   I spent a total of  20 minutes   in face to face in clinical consultation, greater than 50% of which was counseling/coordinating care for ultrasound-guided aspiration of abdominal wall fluid collection

## 2016-05-13 NOTE — Progress Notes (Signed)
Provided abdominal binder for pt in short stay/or room 11.  Pt stated he knows how to use binder.

## 2016-05-18 ENCOUNTER — Ambulatory Visit (HOSPITAL_COMMUNITY): Payer: BLUE CROSS/BLUE SHIELD

## 2016-05-18 LAB — AEROBIC/ANAEROBIC CULTURE (SURGICAL/DEEP WOUND): CULTURE: NO GROWTH

## 2016-05-18 LAB — AEROBIC/ANAEROBIC CULTURE W GRAM STAIN (SURGICAL/DEEP WOUND)

## 2016-11-07 DIAGNOSIS — Z23 Encounter for immunization: Secondary | ICD-10-CM | POA: Diagnosis not present

## 2016-12-27 DIAGNOSIS — J029 Acute pharyngitis, unspecified: Secondary | ICD-10-CM | POA: Diagnosis not present

## 2016-12-27 DIAGNOSIS — Z6841 Body Mass Index (BMI) 40.0 and over, adult: Secondary | ICD-10-CM | POA: Diagnosis not present

## 2016-12-27 DIAGNOSIS — E785 Hyperlipidemia, unspecified: Secondary | ICD-10-CM | POA: Diagnosis not present

## 2016-12-27 DIAGNOSIS — R252 Cramp and spasm: Secondary | ICD-10-CM | POA: Diagnosis not present

## 2017-01-07 ENCOUNTER — Emergency Department (HOSPITAL_BASED_OUTPATIENT_CLINIC_OR_DEPARTMENT_OTHER)
Admission: EM | Admit: 2017-01-07 | Discharge: 2017-01-07 | Disposition: A | Discharge status: Home / Self Care | Payer: BLUE CROSS/BLUE SHIELD | Attending: Emergency Medicine | Admitting: Emergency Medicine

## 2017-01-07 ENCOUNTER — Other Ambulatory Visit: Payer: Self-pay

## 2017-01-07 ENCOUNTER — Encounter (HOSPITAL_BASED_OUTPATIENT_CLINIC_OR_DEPARTMENT_OTHER): Payer: Self-pay

## 2017-01-07 DIAGNOSIS — Z7982 Long term (current) use of aspirin: Secondary | ICD-10-CM | POA: Diagnosis not present

## 2017-01-07 DIAGNOSIS — Z79899 Other long term (current) drug therapy: Secondary | ICD-10-CM | POA: Insufficient documentation

## 2017-01-07 DIAGNOSIS — I10 Essential (primary) hypertension: Secondary | ICD-10-CM | POA: Insufficient documentation

## 2017-01-07 DIAGNOSIS — K529 Noninfective gastroenteritis and colitis, unspecified: Secondary | ICD-10-CM

## 2017-01-07 DIAGNOSIS — R1110 Vomiting, unspecified: Secondary | ICD-10-CM | POA: Diagnosis not present

## 2017-01-07 MED ORDER — ONDANSETRON 8 MG PO TBDP
8.0000 mg | ORAL_TABLET | Freq: Once | ORAL | Status: AC
  Administered 2017-01-07: 8 mg via ORAL
  Filled 2017-01-07: qty 1

## 2017-01-07 MED ORDER — ONDANSETRON 8 MG PO TBDP: ORAL_TABLET | ORAL | 0 refills | Status: DC

## 2017-01-07 NOTE — ED Notes (Signed)
Pt has tolerated PO challenge without issue

## 2017-01-07 NOTE — Discharge Instructions (Signed)
Zofran as prescribed as needed for nausea.  Clear liquid diet as tolerated for the next 12 hours, then slowly advance diet to normal.  Return to the emergency department if you develop high fever, bloody stool, severe abdominal pain, or other new and concerning symptoms.

## 2017-01-07 NOTE — ED Provider Notes (Signed)
[facility] EMERGENCY DEPARTMENT Provider Note   CSN: [id number] Arrival date & time: 01/07/17  0247     History   Chief Complaint Chief Complaint  Patient presents with  . Emesis    HPI Michael Harrison is a 46 y.o. male.  Patient is a 46 year old male presenting for evaluation of nausea, vomiting, diarrhea, and abdominal cramping that started after eating at [employer] this evening.  Several other family members became ill in a similar fashion.  He denies any bloody stools.  He denies any fevers.   The history is provided by the patient.  Emesis   This is a new problem. The current episode started 1 to 2 hours ago. The problem occurs continuously. The problem has been gradually improving. The emesis has an appearance of stomach contents. There has been no fever. Associated symptoms include abdominal pain, chills and diarrhea. Pertinent negatives include no fever. Risk factors include ill contacts and suspect food intake.    Past Medical History:  Diagnosis Date  . Arthritis    "knees" (04/20/2016)  . Dyspnea    with exertion  . Hyperlipidemia   . Hypertension    Borderline  no medication  . Migraine    "often in my teens; yearly now" (04/20/2016)  . Morbid obesity (HCC)   . OSA on CPAP   . Vitamin D deficiency     Patient Active Problem List   Diagnosis Date Noted  . Recurrent ventral hernia with incarceration 04/20/2016  . INCI HERNIA WITHOUT MENTION OBSTRUCTION/GANGRENE 03/14/2013  . Ventral hernia 01/21/2013  . Sinusitis nasal 07/26/2012  . HTN (hypertension) 04/16/2012  . HLD (hyperlipidemia) 04/16/2012  . Severe obesity (BMI >= 40) (HCC) 04/16/2012  . Unspecified vitamin D deficiency 04/16/2012  . Obstructive sleep apnea 04/16/2012  . Adjustment reaction 04/16/2012    Past Surgical History:  Procedure Laterality Date  . BREATH TEK H PYLORI N/A 04/22/2013   Procedure: BREATH TEK H PYLORI;  Surgeon: Valarie MerinoMatthew B Martin, MD;  [facility];  Service: General;  Laterality: N/A;  . HERNIA REPAIR    . INSERTION OF MESH N/A 04/20/2016   Procedure: INSERTION OF MESH;  Surgeon: Manus RuddMatthew Tsuei, MD;  Location: [facility] OR;  Service: General;  Laterality: N/A;  . LAPAROSCOPIC INCISIONAL / UMBILICAL / VENTRAL HERNIA REPAIR  04/20/2016   LAPAROSCOPIC VENTRAL HERNIA REPAIR WITH MESH/notes 04/20/2016  . LIPOMA EXCISION  ~ 1998   chest area  . UMBILICAL HERNIA REPAIR  2009  . VENTRAL HERNIA REPAIR N/A 04/20/2016   Procedure: LAPAROSCOPIC VENTRAL HERNIA REPAIR WITH MESH;  Surgeon: Manus RuddMatthew Tsuei, MD;  Location: [facility] OR;  Service: General;  Laterality: N/A;       Home Medications    Prior to Admission medications   Medication Sig Start Date End Date Taking? Authorizing Provider  aspirin EC 81 MG tablet Take 1 tablet (81 mg total) by mouth daily. 07/26/12   Ileana LaddWong, Francis P, MD  atorvastatin (LIPITOR) 40 MG tablet Take 1 tablet (40 mg total) by mouth daily. 01/08/14   Ernestina PennaMoore, Donald W, MD  cyclobenzaprine (FLEXERIL) 10 MG tablet Take 10 mg by mouth 3 (three) times daily as needed for muscle spasms.    [provider]  fluticasone (FLONASE) 50 MCG/ACT nasal spray Place 2 sprays into the nose daily. Patient taking differently: Place 2 sprays into the nose daily as needed for allergies.  04/11/12   Ileana LaddWong, Francis P, MD  naproxen sodium (ALEVE) 220 MG tablet Take 220 mg  by mouth daily as needed.    [provider]  oxyCODONE (OXY IR/ROXICODONE) 5 MG immediate release tablet Take 1 tablet (5 mg total) by mouth every 6 (six) hours as needed for moderate pain. 04/21/16   Emelia LoronWakefield, Matthew, MD    Family History Family History  Problem Relation Age of Onset  . Diabetes Mother   . Heart disease Mother     Social History Social History   Tobacco Use  . Smoking status: Never Smoker  . Smokeless tobacco: Never Used  Substance Use Topics  . Alcohol use: Yes    Comment: 04/20/2016 "I quit drinking in 2003; never had problem  w/alcohol"  . Drug use: No     Allergies   Iohexol   Review of Systems Review of Systems  Constitutional: Positive for chills. Negative for fever.  Gastrointestinal: Positive for abdominal pain, diarrhea and vomiting.  All other systems reviewed and are negative.    Physical Exam Updated Vital Signs BP (!) 164/84 (BP Location: Right Arm)   Pulse (!) 101   Temp 98.8 F (37.1 C) (Oral)   Resp 20   Ht 5\' 10"  (1.778 m)   Wt (!) 181.4 kg (400 lb)   SpO2 100%   BMI 57.39 kg/m   Physical Exam  Constitutional: He is oriented to person, place, and time. He appears well-developed and well-nourished. No distress.  HENT:  Head: Normocephalic and atraumatic.  Mouth/Throat: Oropharynx is clear and moist.  Neck: Normal range of motion. Neck supple.  Cardiovascular: Normal rate and regular rhythm. Exam reveals no friction rub.  No murmur heard. Pulmonary/Chest: Effort normal and breath sounds normal. No respiratory distress. He has no wheezes. He has no rales.  Abdominal: Soft. Bowel sounds are normal. He exhibits no distension. There is no tenderness.  Musculoskeletal: Normal range of motion. He exhibits no edema.  Neurological: He is alert and oriented to person, place, and time. Coordination normal.  Skin: Skin is warm and dry. He is not diaphoretic.  Nursing note and vitals reviewed.    ED Treatments / Results  Labs (all labs ordered are listed, but only abnormal results are displayed) Labs Reviewed - No data to display  EKG  EKG Interpretation None       Radiology No results found.  Procedures Procedures (including critical care time)  Medications Ordered in ED Medications  ondansetron (ZOFRAN-ODT) disintegrating tablet 8 mg (8 mg Oral Given 01/07/17 0320)     Initial Impression / Assessment and Plan / ED Course  I have reviewed the triage vital signs and the nursing notes.  Pertinent labs & imaging results that were available during my care of the  patient were reviewed by me and considered in my medical decision making (see chart for details).  Symptoms most consistent with a viral gastroenteritis or possibly food borne illness.  He is feeling better after receiving Zofran and tolerating ginger ale without difficulty.  I see no indication for further workup.  He will be discharged with Zofran and return as needed.  Final Clinical Impressions(s) / ED Diagnoses   Final diagnoses:  None    ED Discharge Orders    None       Geoffery Lyonselo, Miking Usrey, MD 01/07/17 [id number]

## 2017-01-07 NOTE — ED Notes (Signed)
Throughout stay, pt has not vomited or had a bout of diarrhea.  He verbalizes understanding of dc instructions and denies any further needs at this time

## 2017-01-07 NOTE — ED Triage Notes (Signed)
Pt c/o n/v/d since 2200 tonight, has not tried anything for symptoms, entire family is here for the same thing

## 2017-01-07 NOTE — ED Notes (Signed)
Pt given gingerale for PO challenge 

## 2017-01-10 DIAGNOSIS — R7303 Prediabetes: Secondary | ICD-10-CM

## 2017-01-10 HISTORY — DX: Prediabetes: R73.03

## 2017-05-15 DIAGNOSIS — E782 Mixed hyperlipidemia: Secondary | ICD-10-CM | POA: Diagnosis not present

## 2017-05-15 DIAGNOSIS — H0260 Xanthelasma of unspecified eye, unspecified eyelid: Secondary | ICD-10-CM | POA: Diagnosis not present

## 2017-06-08 DIAGNOSIS — G4733 Obstructive sleep apnea (adult) (pediatric): Secondary | ICD-10-CM | POA: Diagnosis not present

## 2017-09-07 DIAGNOSIS — M25561 Pain in right knee: Secondary | ICD-10-CM | POA: Diagnosis not present

## 2017-09-18 DIAGNOSIS — M1712 Unilateral primary osteoarthritis, left knee: Secondary | ICD-10-CM | POA: Diagnosis not present

## 2017-11-08 DIAGNOSIS — M1711 Unilateral primary osteoarthritis, right knee: Secondary | ICD-10-CM | POA: Diagnosis not present

## 2017-11-15 DIAGNOSIS — M1711 Unilateral primary osteoarthritis, right knee: Secondary | ICD-10-CM | POA: Diagnosis not present

## 2017-11-17 DIAGNOSIS — Z23 Encounter for immunization: Secondary | ICD-10-CM | POA: Diagnosis not present

## 2017-11-17 DIAGNOSIS — Z6841 Body Mass Index (BMI) 40.0 and over, adult: Secondary | ICD-10-CM | POA: Diagnosis not present

## 2017-11-17 DIAGNOSIS — E782 Mixed hyperlipidemia: Secondary | ICD-10-CM | POA: Diagnosis not present

## 2017-11-17 DIAGNOSIS — H0260 Xanthelasma of unspecified eye, unspecified eyelid: Secondary | ICD-10-CM | POA: Diagnosis not present

## 2017-11-24 DIAGNOSIS — M1711 Unilateral primary osteoarthritis, right knee: Secondary | ICD-10-CM | POA: Diagnosis not present

## 2017-12-01 DIAGNOSIS — M1711 Unilateral primary osteoarthritis, right knee: Secondary | ICD-10-CM | POA: Diagnosis not present

## 2017-12-21 ENCOUNTER — Ambulatory Visit: Payer: BLUE CROSS/BLUE SHIELD | Admitting: Registered"

## 2018-01-10 HISTORY — PX: LAPAROSCOPIC GASTRIC SLEEVE RESECTION: SHX5895

## 2018-02-06 DIAGNOSIS — Z6841 Body Mass Index (BMI) 40.0 and over, adult: Secondary | ICD-10-CM | POA: Diagnosis not present

## 2018-02-06 DIAGNOSIS — E782 Mixed hyperlipidemia: Secondary | ICD-10-CM | POA: Diagnosis not present

## 2018-02-06 DIAGNOSIS — H0260 Xanthelasma of unspecified eye, unspecified eyelid: Secondary | ICD-10-CM | POA: Diagnosis not present

## 2018-02-06 DIAGNOSIS — M170 Bilateral primary osteoarthritis of knee: Secondary | ICD-10-CM | POA: Diagnosis not present

## 2018-03-08 DIAGNOSIS — Z9884 Bariatric surgery status: Secondary | ICD-10-CM | POA: Diagnosis not present

## 2018-03-08 DIAGNOSIS — L0390 Cellulitis, unspecified: Secondary | ICD-10-CM | POA: Diagnosis not present

## 2018-03-15 ENCOUNTER — Other Ambulatory Visit (HOSPITAL_BASED_OUTPATIENT_CLINIC_OR_DEPARTMENT_OTHER): Payer: Self-pay | Admitting: Family Medicine

## 2018-03-15 ENCOUNTER — Encounter (HOSPITAL_BASED_OUTPATIENT_CLINIC_OR_DEPARTMENT_OTHER): Payer: Self-pay | Admitting: Radiology

## 2018-03-15 ENCOUNTER — Ambulatory Visit (HOSPITAL_BASED_OUTPATIENT_CLINIC_OR_DEPARTMENT_OTHER)
Admission: RE | Admit: 2018-03-15 | Discharge: 2018-03-15 | Disposition: A | Discharge status: Home / Self Care | Payer: BLUE CROSS/BLUE SHIELD | Source: Ambulatory Visit | Attending: Family Medicine | Admitting: Family Medicine

## 2018-03-15 DIAGNOSIS — Z9889 Other specified postprocedural states: Secondary | ICD-10-CM | POA: Diagnosis not present

## 2018-03-15 DIAGNOSIS — Z9884 Bariatric surgery status: Secondary | ICD-10-CM | POA: Diagnosis not present

## 2018-03-15 DIAGNOSIS — R0689 Other abnormalities of breathing: Secondary | ICD-10-CM | POA: Diagnosis not present

## 2018-03-15 DIAGNOSIS — R0600 Dyspnea, unspecified: Secondary | ICD-10-CM | POA: Diagnosis not present

## 2018-03-15 DIAGNOSIS — R0602 Shortness of breath: Secondary | ICD-10-CM | POA: Diagnosis not present

## 2018-03-15 MED ORDER — IOPAMIDOL (ISOVUE-370) INJECTION 76%
100.0000 mL | Freq: Once | INTRAVENOUS | Status: AC | PRN
  Administered 2018-03-15: 100 mL via INTRAVENOUS

## 2018-04-13 DIAGNOSIS — Z9884 Bariatric surgery status: Secondary | ICD-10-CM | POA: Diagnosis not present

## 2018-04-13 DIAGNOSIS — R0600 Dyspnea, unspecified: Secondary | ICD-10-CM | POA: Diagnosis not present

## 2018-04-13 DIAGNOSIS — R0689 Other abnormalities of breathing: Secondary | ICD-10-CM | POA: Diagnosis not present

## 2018-04-13 DIAGNOSIS — M1710 Unilateral primary osteoarthritis, unspecified knee: Secondary | ICD-10-CM | POA: Diagnosis not present

## 2018-04-24 DIAGNOSIS — M545 Low back pain: Secondary | ICD-10-CM | POA: Diagnosis not present

## 2018-04-24 DIAGNOSIS — M5417 Radiculopathy, lumbosacral region: Secondary | ICD-10-CM | POA: Diagnosis not present

## 2018-07-12 DIAGNOSIS — L918 Other hypertrophic disorders of the skin: Secondary | ICD-10-CM | POA: Diagnosis not present

## 2018-07-26 DIAGNOSIS — K219 Gastro-esophageal reflux disease without esophagitis: Secondary | ICD-10-CM | POA: Diagnosis not present

## 2018-07-26 DIAGNOSIS — L918 Other hypertrophic disorders of the skin: Secondary | ICD-10-CM | POA: Diagnosis not present

## 2018-07-26 DIAGNOSIS — H0260 Xanthelasma of unspecified eye, unspecified eyelid: Secondary | ICD-10-CM | POA: Diagnosis not present

## 2018-07-26 DIAGNOSIS — E782 Mixed hyperlipidemia: Secondary | ICD-10-CM | POA: Diagnosis not present

## 2018-09-26 DIAGNOSIS — E782 Mixed hyperlipidemia: Secondary | ICD-10-CM | POA: Diagnosis not present

## 2020-01-06 ENCOUNTER — Telehealth: Payer: Self-pay | Admitting: Infectious Diseases

## 2020-01-06 NOTE — Telephone Encounter (Signed)
Called to Discuss with patient about Covid symptoms and the use of the monoclonal antibody infusion for those with mild to moderate Covid symptoms and at a high risk of hospitalization.     Pt appears to qualify for this infusion due to co-morbid conditions and/or a member of an at-risk group in accordance with the FDA Emergency Use Authorization.    Unable to reach pt - LVM and sent Mychart message Symptom onset: unclear Vaccinated: unclear Qualified for Infusion: BMI > 40   Rexene Alberts, MSN, NP-C [facility]  [email or web address] Pager: [phone] Office: [phone] [facility] Main Line: [phone]

## 2020-02-25 ENCOUNTER — Emergency Department (HOSPITAL_BASED_OUTPATIENT_CLINIC_OR_DEPARTMENT_OTHER): Payer: 59

## 2020-02-25 ENCOUNTER — Encounter (HOSPITAL_BASED_OUTPATIENT_CLINIC_OR_DEPARTMENT_OTHER): Payer: Self-pay

## 2020-02-25 ENCOUNTER — Emergency Department (HOSPITAL_BASED_OUTPATIENT_CLINIC_OR_DEPARTMENT_OTHER)
Admission: EM | Admit: 2020-02-25 | Discharge: 2020-02-25 | Disposition: A | Discharge status: Home / Self Care | Payer: 59 | Attending: Emergency Medicine | Admitting: Emergency Medicine

## 2020-02-25 ENCOUNTER — Other Ambulatory Visit: Payer: Self-pay

## 2020-02-25 DIAGNOSIS — R519 Headache, unspecified: Secondary | ICD-10-CM | POA: Insufficient documentation

## 2020-02-25 DIAGNOSIS — Z7982 Long term (current) use of aspirin: Secondary | ICD-10-CM | POA: Insufficient documentation

## 2020-02-25 DIAGNOSIS — R072 Precordial pain: Secondary | ICD-10-CM | POA: Diagnosis not present

## 2020-02-25 DIAGNOSIS — Z8616 Personal history of COVID-19: Secondary | ICD-10-CM | POA: Diagnosis not present

## 2020-02-25 DIAGNOSIS — R1013 Epigastric pain: Secondary | ICD-10-CM | POA: Insufficient documentation

## 2020-02-25 DIAGNOSIS — I10 Essential (primary) hypertension: Secondary | ICD-10-CM | POA: Insufficient documentation

## 2020-02-25 DIAGNOSIS — R079 Chest pain, unspecified: Secondary | ICD-10-CM

## 2020-02-25 LAB — HEPATIC FUNCTION PANEL
ALT: 20 U/L (ref 0–44)
AST: 18 U/L (ref 15–41)
Albumin: 4.1 g/dL (ref 3.5–5.0)
Alkaline Phosphatase: 59 U/L (ref 38–126)
Bilirubin, Direct: <0.1 mg/dL (ref 0.0–0.2)
Total Bilirubin: 0.6 mg/dL (ref 0.3–1.2)
Total Protein: 7.3 g/dL (ref 6.5–8.1)

## 2020-02-25 LAB — BASIC METABOLIC PANEL
Anion gap: 11 (ref 5–15)
BUN: 15 mg/dL (ref 6–20)
CO2: 25 mmol/L (ref 22–32)
Calcium: 9 mg/dL (ref 8.9–10.3)
Chloride: 102 mmol/L (ref 98–111)
Creatinine, Ser: 1.05 mg/dL (ref 0.61–1.24)
GFR, Estimated: >60 mL/min (ref >60)
Glucose, Bld: 97 mg/dL (ref 70–99)
Potassium: 3.3 mmol/L — ABNORMAL LOW (ref 3.5–5.1)
Sodium: 138 mmol/L (ref 135–145)

## 2020-02-25 LAB — CBC
HCT: 47.2 % (ref 39.0–52.0)
Hemoglobin: 15.5 g/dL (ref 13.0–17.0)
MCH: 29.8 pg (ref 26.0–34.0)
MCHC: 32.8 g/dL (ref 30.0–36.0)
MCV: 90.6 fL (ref 80.0–100.0)
Platelets: 370 10*3/uL (ref 150–400)
RBC: 5.21 MIL/uL (ref 4.22–5.81)
RDW: 14.2 % (ref 11.5–15.5)
WBC: 9 10*3/uL (ref 4.0–10.5)
nRBC: 0 % (ref 0.0–0.2)

## 2020-02-25 LAB — TROPONIN I (HIGH SENSITIVITY)
Troponin I (High Sensitivity): 3 ng/L (ref <18)
Troponin I (High Sensitivity): 3 ng/L (ref <18)

## 2020-02-25 MED ORDER — ASPIRIN 81 MG PO CHEW
324.0000 mg | CHEWABLE_TABLET | Freq: Once | ORAL | Status: DC
  Filled 2020-02-25: qty 4

## 2020-02-25 NOTE — Discharge Instructions (Addendum)
You are seen in the emergency department for evaluation of chest pain.  You had blood work EKG and a chest x-ray that did not show any evidence of a heart attack.  Please contact Dr. Modesto Charon for close follow-up.  You may need further evaluation with cardiology or even gastroenterology.  Return to the emergency department if any worsening or concerning symptoms

## 2020-02-25 NOTE — ED Provider Notes (Signed)
[facility] EMERGENCY DEPARTMENT Provider Note   CSN: [id number] Arrival date & time: 02/25/20  1123     History Chief Complaint  Patient presents with  . Chest Pain    Michael Harrison is a 50 y.o. male.  He has had 3 episodes of substernal chest pressure radiated to his neck leading to a headache.  Happened again today lasted about 30 minutes.  Currently pain-free.  He attributes this to stress as he had a lot of work and family issues.  Had COVID at the end of December.  Doing well from that.  No cough shortness of breath diaphoresis nausea or vomiting with pain.  Has had a gastric sleeve and still has his gallbladder.  Family history of heart disease  The history is provided by the patient.  Chest Pain Pain location:  Substernal area and epigastric Pain quality: pressure   Pain radiates to:  Neck Pain severity:  Moderate Onset quality:  Sudden Duration:  30 minutes Timing:  Constant Progression:  Resolved Chronicity:  Recurrent Context: at rest   Relieved by:  None tried Worsened by:  Nothing Ineffective treatments:  None tried Associated symptoms: headache   Associated symptoms: no abdominal pain, no cough, no diaphoresis, no dizziness, no fever, no lower extremity edema, no nausea, no shortness of breath and no vomiting   Risk factors: diabetes mellitus, high cholesterol, hypertension and male sex   Risk factors: no prior DVT/PE and no smoking     HPI: A 50 year old patient with a history of hypertension, hypercholesterolemia and obesity presents for evaluation of chest pain. Initial onset of pain was approximately 1-3 hours ago. The patient's chest pain is described as heaviness/pressure/tightness and is not worse with exertion. The patient's chest pain is middle- or left-sided, is not well-localized, is not sharp and does radiate to the arms/jaw/neck. The patient does not complain of nausea and denies diaphoresis. The patient has a family history of coronary  artery disease in a first-degree relative with onset less than age 28. The patient has no history of stroke, has no history of peripheral artery disease, has not smoked in the past 90 days and denies any history of treated diabetes.   Past Medical History:  Diagnosis Date  . Arthritis    "knees" (04/20/2016)  . Dyspnea    with exertion  . Hyperlipidemia   . Hypertension    Borderline  no medication  . Migraine    "often in my teens; yearly now" (04/20/2016)  . Morbid obesity (HCC)   . OSA on CPAP   . Vitamin D deficiency     Patient Active Problem List   Diagnosis Date Noted  . Recurrent ventral hernia with incarceration 04/20/2016  . INCI HERNIA WITHOUT MENTION OBSTRUCTION/GANGRENE 03/14/2013  . Ventral hernia 01/21/2013  . Sinusitis nasal 07/26/2012  . HTN (hypertension) 04/16/2012  . HLD (hyperlipidemia) 04/16/2012  . Severe obesity (BMI >= 40) (HCC) 04/16/2012  . Unspecified vitamin D deficiency 04/16/2012  . Obstructive sleep apnea 04/16/2012  . Adjustment reaction 04/16/2012    Past Surgical History:  Procedure Laterality Date  . BREATH TEK H PYLORI N/A 04/22/2013   Procedure: BREATH TEK H PYLORI;  Surgeon: Valarie Merino, MD;  Location: [facility];  Service: General;  Laterality: N/A;  . HERNIA REPAIR    . INSERTION OF MESH N/A 04/20/2016   Procedure: INSERTION OF MESH;  Surgeon: Manus Rudd, MD;  Location: [facility] OR;  Service: General;  Laterality: N/A;  . LAPAROSCOPIC INCISIONAL /  UMBILICAL / VENTRAL HERNIA REPAIR  04/20/2016   LAPAROSCOPIC VENTRAL HERNIA REPAIR WITH MESH/notes 04/20/2016  . LIPOMA EXCISION  ~ 1998   chest area  . UMBILICAL HERNIA REPAIR  2009  . VENTRAL HERNIA REPAIR N/A 04/20/2016   Procedure: LAPAROSCOPIC VENTRAL HERNIA REPAIR WITH MESH;  Surgeon: Manus RuddMatthew Tsuei, MD;  Location: [facility] OR;  Service: General;  Laterality: N/A;       Family History  Problem Relation Age of Onset  . Diabetes Mother   . Heart disease Mother     Social History    Tobacco Use  . Smoking status: Never Smoker  . Smokeless tobacco: Never Used  Substance Use Topics  . Alcohol use: Yes    Comment: drinks bourbon nightly  . Drug use: No    Home Medications Prior to Admission medications   Medication Sig Start Date End Date Taking? Authorizing Provider  aspirin EC 81 MG tablet Take 1 tablet (81 mg total) by mouth daily. 07/26/12   Ileana LaddWong, Francis P, MD  atorvastatin (LIPITOR) 40 MG tablet Take 1 tablet (40 mg total) by mouth daily. 01/08/14   Ernestina PennaMoore, Donald W, MD  cyclobenzaprine (FLEXERIL) 10 MG tablet Take 10 mg by mouth 3 (three) times daily as needed for muscle spasms.    [provider]  fluticasone (FLONASE) 50 MCG/ACT nasal spray Place 2 sprays into the nose daily. Patient taking differently: Place 2 sprays into the nose daily as needed for allergies.  04/11/12   Ileana LaddWong, Francis P, MD  naproxen sodium (ALEVE) 220 MG tablet Take 220 mg by mouth daily as needed.    [provider]  ondansetron (ZOFRAN ODT) 8 MG disintegrating tablet 8mg  ODT q4 hours prn nausea 01/07/17   Geoffery Lyonselo, Douglas, MD  oxyCODONE (OXY IR/ROXICODONE) 5 MG immediate release tablet Take 1 tablet (5 mg total) by mouth every 6 (six) hours as needed for moderate pain. 04/21/16   Emelia LoronWakefield, Matthew, MD    Allergies    Patient has no known allergies.  Review of Systems   Review of Systems  Constitutional: Negative for diaphoresis and fever.  HENT: Negative for sore throat.   Eyes: Negative for visual disturbance.  Respiratory: Negative for cough and shortness of breath.   Cardiovascular: Positive for chest pain.  Gastrointestinal: Negative for abdominal pain, nausea and vomiting.  Genitourinary: Negative for dysuria.  Musculoskeletal: Positive for neck pain.  Skin: Negative for rash.  Neurological: Positive for headaches. Negative for dizziness.    Physical Exam Updated Vital Signs BP (!) 166/69 (BP Location: Right Arm)   Pulse 98   Temp 97.9 F (36.6 C)  (Oral)   Resp (!) 22   Ht 5\' 10"  (1.778 m)   Wt (!) 140.6 kg   SpO2 99%   BMI 44.48 kg/m   Physical Exam Vitals and nursing note reviewed.  Constitutional:      General: He is not in acute distress.    Appearance: He is well-developed and well-nourished. He is obese.  HENT:     Head: Normocephalic and atraumatic.  Eyes:     Conjunctiva/sclera: Conjunctivae normal.  Cardiovascular:     Rate and Rhythm: Normal rate and regular rhythm.     Heart sounds: Normal heart sounds. No murmur heard.   Pulmonary:     Effort: Pulmonary effort is normal. No respiratory distress.     Breath sounds: Normal breath sounds.  Abdominal:     Palpations: Abdomen is soft. There is no mass.     Tenderness:  There is no abdominal tenderness. There is no guarding or rebound.  Musculoskeletal:        General: No edema.     Cervical back: Neck supple.  Skin:    General: Skin is warm and dry.     Capillary Refill: Capillary refill takes less than 2 seconds.  Neurological:     General: No focal deficit present.     Mental Status: He is alert.  Psychiatric:        Mood and Affect: Mood and affect normal.     ED Results / Procedures / Treatments   Labs (all labs ordered are listed, but only abnormal results are displayed) Labs Reviewed  BASIC METABOLIC PANEL - Abnormal; Notable for the following components:      Result Value   Potassium 3.3 (*)    All other components within normal limits  CBC  HEPATIC FUNCTION PANEL  TROPONIN I (HIGH SENSITIVITY)  TROPONIN I (HIGH SENSITIVITY)    EKG EKG Interpretation  Date/Time:  Tuesday February 25 2020 11:28:13 EST Ventricular Rate:  89 PR Interval:  142 QRS Duration: 82 QT Interval:  342 QTC Calculation: 416 R Axis:   26 Text Interpretation: Normal sinus rhythm Nonspecific ST abnormality Abnormal ECG No significant change since prior 4/18 Confirmed by Meridee Score [phone]) on 02/25/2020 11:50:51 AM   Radiology DG Chest 2 View  Result  Date: 02/25/2020 CLINICAL DATA:  Chest pain and pain last 2 weeks. EXAM: CHEST - 2 VIEW COMPARISON:  Chest radiograph April 02, 2013 and CT chest March 15, 2018 FINDINGS: The heart size and mediastinal contours are within normal limits. Focal consolidation. Pleural effusion. No pneumothorax. The visualized skeletal structures are unremarkable. IMPRESSION: No acute cardiopulmonary disease. Electronically Signed   By: Maudry Mayhew MD   On: 02/25/2020 11:53    Procedures Procedures   Medications Ordered in ED Medications - No data to display  ED Course  I have reviewed the triage vital signs and the nursing notes.  Pertinent labs & imaging results that were available during my care of the patient were reviewed by me and considered in my medical decision making (see chart for details).  Clinical Course as of 02/25/20 1721  Tue Feb 25, 2020  1236 Ordered patient aspirin but he said he actually took aspirin at 10 AM already. [MB]  1434 Reviewed results with patient and wife.  They are comfortable plan for outpatient follow-up with PCP.  He is seeing cardiology remotely.  Recommended that he be referred on to cardiology again.  Return instructions discussed [MB]    Clinical Course User Index [MB] Terrilee Files, MD   MDM Rules/Calculators/A&P HEAR Score: 5                       This patient complains of this discomfort and bulging; this involves an extensive number of treatment Options and is a complaint that carries with it a high risk of complications and Morbidity. The differential includes ACS, pneumothorax, GERD, cholecystitis,, PE  I ordered, reviewed and interpreted labs, which included CBC with normal white count normal hemoglobin, chemistries normal other than mildly low potassium, troponins flat, LFTs normal I ordered imaging studies which included chest x-ray and I independently    visualized and interpreted imaging which showed no acute pulmonary disease Additional history  obtained from patient's wife Previous records obtained and reviewed in epic, no recent admissions  After the interventions stated above, I reevaluated the patient and found  patient currently be pain-free.  No evidence of ACS.  He is comfortable plan for follow-up PCP.  He is already on a PPI and recommended other over-the-counter GI agents.  Return instructions discussed.   Final Clinical Impression(s) / ED Diagnoses Final diagnoses:  Nonspecific chest pain    Rx / DC Orders ED Discharge Orders    None       Terrilee Files, MD 02/25/20 1723

## 2020-02-25 NOTE — ED Triage Notes (Signed)
Pt arrives stating he had pain in chest, neck and head this morning. States has improved. States also occurred 2 weeks ago. Denies SOB/dizziness. States has had increased stress.

## 2020-03-13 ENCOUNTER — Other Ambulatory Visit: Payer: Self-pay | Admitting: Otolaryngology

## 2020-03-13 DIAGNOSIS — J329 Chronic sinusitis, unspecified: Secondary | ICD-10-CM

## 2020-03-23 NOTE — Progress Notes (Signed)
Cardiology Office Note:   Date:  03/24/2020  NAME:  Michael Harrison    MRN: [id number] DOB:  May 31, 1970   PCP:  Ileana Ladd, MD  Cardiologist:  No primary care provider on file.   Referring MD: Ileana Ladd, MD   Chief Complaint  Patient presents with  . Chest Pain   History of Present Illness:   Michael Harrison is a 50 y.o. male with a hx of HLD, obesity who is being seen today for the evaluation of chest pain at the request of Ileana Ladd, MD. Seen in ER 02/25/2020 for CP. Negative work-up for ACS.  He reports he has had roughly 3 episodes of chest pain over the last 2 to 3 months.  The first episode occurred while shoveling snow.  He reports he had chest pressure in the left chest that went into his jaw.  Symptoms resolved with cessation of activity.  He reports he has had 2 other episodes which have occurred with stress and anxiety.  He owns a heating and air business.  Apparently there have been several calls at work.  He reports this is caused him similar symptoms.  The other symptoms have occurred while sitting.  They resolve with calming down and deep breathing.  He did go to the emergency room and work-up was negative for an acute coronary syndrome.  Medical history is significant for obesity status post gastric sleeve.  BMI is 49.  He does have sleep apnea but uses his sleep machine.  He has no history of hypertension, diabetes.  He is never had a heart attack or stroke.  His mother died of a heart attack at 44.  His cholesterol shows elevated LDL up to 167.  His 10-year ASCVD risk score is 3.5% but this does not account for his family history.  He is low risk from this standpoint.  He does take Crestor 2 days/week.  Apparently he had issues with Lipitor.  EKG in office demonstrates sinus rhythm with no acute ischemic changes or evidence of infarction.  He does not smoke or use drugs.  He reports that he does drink 6 to 8 ounces of bourbon nightly.  No concerns for alcohol  withdrawal.  He does not consider himself an alcoholic.  He presents with his wife.  They have 2 small children.  He reports that he owns a heating and air business as detailed above.  Work has been stressful.  Problem List 1. HLD -T chol 230, HDL 50, LDL 167, triglycerides 77 2. Obesity -BMI 49 -gastric sleeve 3. OSA -CPAP  Past Medical History: Past Medical History:  Diagnosis Date  . Arthritis    "knees" (04/20/2016)  . Dyspnea    with exertion  . Hyperlipidemia   . Hypertension    Borderline  no medication  . Migraine    "often in my teens; yearly now" (04/20/2016)  . Morbid obesity (HCC)   . OSA on CPAP   . Vitamin D deficiency     Past Surgical History: Past Surgical History:  Procedure Laterality Date  . BREATH TEK H PYLORI N/A 04/22/2013   Procedure: BREATH TEK H PYLORI;  Surgeon: Valarie Merino, MD;  Location: [facility];  Service: General;  Laterality: N/A;  . HERNIA REPAIR    . INSERTION OF MESH N/A 04/20/2016   Procedure: INSERTION OF MESH;  Surgeon: Manus Rudd, MD;  Location: [facility];  Service: General;  Laterality: N/A;  . LAPAROSCOPIC INCISIONAL / UMBILICAL /  VENTRAL HERNIA REPAIR  04/20/2016   LAPAROSCOPIC VENTRAL HERNIA REPAIR WITH MESH/notes 04/20/2016  . LIPOMA EXCISION  ~ 1998   chest area  . UMBILICAL HERNIA REPAIR  2009  . VENTRAL HERNIA REPAIR N/A 04/20/2016   Procedure: LAPAROSCOPIC VENTRAL HERNIA REPAIR WITH MESH;  Surgeon: Manus Rudd, MD;  Location: [facility] OR;  Service: General;  Laterality: N/A;    Current Medications: Current Meds  Medication Sig  . fluticasone (FLONASE) 50 MCG/ACT nasal spray Place 2 sprays into the nose daily.  . metoprolol tartrate (LOPRESSOR) 100 MG tablet Take 1 tablet by mouth once for procedure.  . naproxen sodium (ALEVE) 220 MG tablet Take 220 mg by mouth daily as needed.  . pantoprazole (PROTONIX) 40 MG tablet Take 40 mg by mouth daily. 1 Tablet Daily  . rosuvastatin (CRESTOR) 20 MG tablet 1 tablet  .  [DISCONTINUED] aspirin EC 81 MG tablet Take 1 tablet (81 mg total) by mouth daily.     Allergies:    Patient has no known allergies.   Social History: Social History   Socioeconomic History  . Marital status: Married    Spouse name: Not on file  . Number of children: 2  . Years of education: Not on file  . Highest education level: Not on file  Occupational History  . Not on file  Tobacco Use  . Smoking status: Never Smoker  . Smokeless tobacco: Never Used  Substance and Sexual Activity  . Alcohol use: Yes    Comment: drinks bourbon nightly  . Drug use: No  . Sexual activity: Yes  Other Topics Concern  . Not on file  Social History Narrative  . Not on file   Social Determinants of Health   Financial Resource Strain: Not on file  Food Insecurity: Not on file  Transportation Needs: Not on file  Physical Activity: Not on file  Stress: Not on file  Social Connections: Not on file     Family History: The patient's family history includes Diabetes in his mother; Heart disease in his maternal grandmother and paternal grandfather; Heart disease (age of onset: 58) in his mother.  ROS:   All other ROS reviewed and negative. Pertinent positives noted in the HPI.     EKGs/Labs/Other Studies Reviewed:   The following studies were personally reviewed by me today:  EKG:  EKG is ordered today.  The ekg ordered today demonstrates normal sinus rhythm heart rate 92, no acute ischemic changes or evidence of infarction, and was personally reviewed by me.   Recent Labs: 02/25/2020: ALT 20; BUN 15; Creatinine, Ser 1.05; Hemoglobin 15.5; Platelets 370; Potassium 3.3; Sodium 138   Recent Lipid Panel    Component Value Date/Time   CHOL 129 03/06/2013 1635   CHOL 136 11/06/2012 1112   CHOL 165 07/26/2012 1646   TRIG 128 03/06/2013 1635   TRIG 99 07/26/2012 1646   HDL 34 (L) 03/06/2013 1635   HDL 37 (L) 11/06/2012 1112   HDL 37 (L) 07/26/2012 1646   CHOLHDL 3.8 03/06/2013 1635    VLDL 26 03/06/2013 1635   LDLCALC 69 03/06/2013 1635   LDLCALC 77 11/06/2012 1112   LDLCALC 108 (H) 07/26/2012 1646    Physical Exam:   VS:  BP 128/82   Pulse 92   Ht 5\' 10"  (1.778 m)   Wt (!) 338 lb (153.3 kg)   SpO2 97%   BMI 48.50 kg/m    Wt Readings from Last 3 Encounters:  03/24/20 (!) 338 lb (153.3  kg)  02/25/20 (!) 310 lb (140.6 kg)  01/07/17 (!) 400 lb (181.4 kg)    General: Well nourished, well developed, in no acute distress Head: Atraumatic, normal size  Eyes: PEERLA, EOMI  Neck: Supple, no JVD Endocrine: No thryomegaly Cardiac: Normal S1, S2; RRR; no murmurs, rubs, or gallops Lungs: Clear to auscultation bilaterally, no wheezing, rhonchi or rales  Abd: Soft, nontender, no hepatomegaly  Ext: No edema, pulses 2+ Musculoskeletal: No deformities, BUE and BLE strength normal and equal Skin: Warm and dry, no rashes   Neuro: Alert and oriented to person, place, time, and situation, CNII-XII grossly intact, no focal deficits  Psych: Normal mood and affect   ASSESSMENT:   Praneel E Renae GlossShelton is a 50 y.o. male who presents for the following: 1. Chest pain, unspecified type   2. Mixed hyperlipidemia     PLAN:   1. Chest pain, unspecified type 2. Mixed hyperlipidemia -Resents with 3 episodes of atypical chest pain.  Described as tightness in his chest going into his jaw.  Symptoms did occur with exercise or shoveling the first time.  The second and third episode have occurred with stressful situations at rest.  Symptoms resolved with deep breathing and relaxation.  CVD risk factors include obesity and hyperlipidemia.  He does have severe acid reflux as well from his gastric sleeve procedure.  This could be contributing.  However, I have recommended coronary CTA for further evaluation to exclude obstructive CAD.  Given his obesity we may only be able to exclude the proximal segments which is okay.  He will take 100 mg of metoprolol tartrate 2 hours before the scan.  He will give  us a BMP today.  He can hold aspirin.  He is too young for this at this time.  He does have evidence of CAD we will recommend this.  Patient also should continue his Crestor for now.  His 10-year ASCVD risk score is 3.5% which is low.  However he does have a strong family history with a mother who passed away from MI at 6261.  We will see him back in 3 months after the above testing.  It is reassuring that his EKG shows no ischemic changes.  When he was in the emergency room he ruled out for an acute coronary syndrome.  Symptoms have not recurred in several weeks.  Disposition: Return in about 3 months (around 06/24/2020).  Medication Adjustments/Labs and Tests Ordered: Current medicines are reviewed at length with the patient today.  Concerns regarding medicines are outlined above.  Orders Placed This Encounter  Procedures  . CT CORONARY MORPH W/CTA COR W/SCORE W/CA W/CM &/OR WO/CM  . CT CORONARY FRACTIONAL FLOW RESERVE DATA PREP  . CT CORONARY FRACTIONAL FLOW RESERVE FLUID ANALYSIS  . Basic metabolic panel  . EKG 12-Lead   Meds ordered this encounter  Medications  . metoprolol tartrate (LOPRESSOR) 100 MG tablet    Sig: Take 1 tablet by mouth once for procedure.    Dispense:  1 tablet    Refill:  0    Patient Instructions  Medication Instructions:  Take Metoprolol 100 mg two hours before CT scan when scheduled.   *If you need a refill on your cardiac medications before your next appointment, please call your pharmacy*   Lab Work: BMET today   If you have labs (blood work) drawn today and your tests are completely normal, you will receive your results only by: Marland Kitchen. MyChart Message (if you have MyChart) OR . A  paper copy in the mail If you have any lab test that is abnormal or we need to change your treatment, we will call you to review the results.   Testing/Procedures: Your physician has requested that you have cardiac CT. Cardiac computed tomography (CT) is a painless test that  uses an x-ray machine to take clear, detailed pictures of your heart. For further information please visit [email or web address] Please follow instruction sheet as given.   Follow-Up: At [facility], you and your health needs are our priority.  As part of our continuing mission to provide you with exceptional heart care, we have created designated Provider Care Teams.  These Care Teams include your primary Cardiologist (physician) and Advanced Practice Providers (APPs -  Physician Assistants and Nurse Practitioners) who all work together to provide you with the care you need, when you need it.  We recommend signing up for the patient portal called "MyChart".  Sign up information is provided on this After Visit Summary.  MyChart is used to connect with patients for Virtual Visits (Telemedicine).  Patients are able to view lab/test results, encounter notes, upcoming appointments, etc.  Non-urgent messages can be sent to your provider as well.   To learn more about what you can do with MyChart, go to [email or web address].    Your next appointment:   3 month(s)  The format for your next appointment:   In Person  Provider:   Lennie Odor, MD   Other Instructions Your cardiac CT will be scheduled at one of the below locations:   [facility] [address] [place], [place] [address] [phone]  If scheduled at [facility], please arrive at the [facility] main entrance (entrance A) of [facility] 30 minutes prior to test start time. Proceed to the [facility] Radiology Department (first floor) to check-in and test prep.   Please follow these instructions carefully (unless otherwise directed):  Hold all erectile dysfunction medications at least 3 days (72 hrs) prior to test.  On the Night Before the Test: . Be sure to Drink plenty of water. . Do not consume any caffeinated/decaffeinated beverages or chocolate 12 hours prior to your test. . Do not  take any antihistamines 12 hours prior to your test.  On the Day of the Test: . Drink plenty of water until 1 hour prior to the test. . Do not eat any food 4 hours prior to the test. . You may take your regular medications prior to the test.  . Take metoprolol (Lopressor) two hours prior to test. . HOLD Furosemide/Hydrochlorothiazide morning of the test. . FEMALES- please wear underwire-free bra if available      After the Test: . Drink plenty of water. . After receiving IV contrast, you may experience a mild flushed feeling. This is normal. . On occasion, you may experience a mild rash up to 24 hours after the test. This is not dangerous. If this occurs, you can take Benadryl 25 mg and increase your fluid intake. . If you experience trouble breathing, this can be serious. If it is severe call 911 IMMEDIATELY. If it is mild, please call our office. . If you take any of these medications: Glipizide/Metformin, Avandament, Glucavance, please do not take 48 hours after completing test unless otherwise instructed.   Once we have confirmed authorization from your insurance company, we will call you to set up a date and time for your test. Based on how quickly your insurance processes prior authorizations requests,  please allow up to 4 weeks to be contacted for scheduling your Cardiac CT appointment. Be advised that routine Cardiac CT appointments could be scheduled as many as 8 weeks after your provider has ordered it.  For non-scheduling related questions, please contact the cardiac imaging nurse navigator should you have any questions/concerns: Rockwell Alexandria, Cardiac Imaging Nurse Navigator Larey Brick, Cardiac Imaging Nurse Navigator [facility] Direct Office Dial: [phone]   For scheduling needs, including cancellations and rescheduling, please call [place], [phone].       Signed, Lenna Gilford. Flora Lipps, MD, [facility]  [facility]  [address] [place], [place] [address] [phone]  03/24/2020 12:40 PM

## 2020-03-24 ENCOUNTER — Encounter: Payer: Self-pay | Admitting: Cardiovascular Disease

## 2020-03-24 ENCOUNTER — Ambulatory Visit (INDEPENDENT_AMBULATORY_CARE_PROVIDER_SITE_OTHER): Payer: 59 | Admitting: Cardiovascular Disease

## 2020-03-24 ENCOUNTER — Other Ambulatory Visit: Payer: Self-pay

## 2020-03-24 VITALS — BP 128/82 | HR 92 | Ht 70.0 in | Wt 338.0 lb

## 2020-03-24 DIAGNOSIS — R079 Chest pain, unspecified: Secondary | ICD-10-CM | POA: Diagnosis not present

## 2020-03-24 DIAGNOSIS — E782 Mixed hyperlipidemia: Secondary | ICD-10-CM

## 2020-03-24 LAB — BASIC METABOLIC PANEL
BUN/Creatinine Ratio: 16 (ref 9–20)
BUN: 14 mg/dL (ref 6–24)
CO2: 22 mmol/L (ref 20–29)
Calcium: 9.3 mg/dL (ref 8.7–10.2)
Chloride: 102 mmol/L (ref 96–106)
Creatinine, Ser: 0.89 mg/dL (ref 0.76–1.27)
Glucose: 80 mg/dL (ref 65–99)
Potassium: 5 mmol/L (ref 3.5–5.2)
Sodium: 140 mmol/L (ref 134–144)
eGFR: 106 mL/min/{1.73_m2} (ref >59)

## 2020-03-24 MED ORDER — METOPROLOL TARTRATE 100 MG PO TABS: ORAL_TABLET | ORAL | 0 refills | Status: DC

## 2020-03-24 NOTE — Patient Instructions (Signed)
Medication Instructions:  Take Metoprolol 100 mg two hours before CT scan when scheduled.   *If you need a refill on your cardiac medications before your next appointment, please call your pharmacy*   Lab Work: BMET today   If you have labs (blood work) drawn today and your tests are completely normal, you will receive your results only by: Marland Kitchen MyChart Message (if you have MyChart) OR . A paper copy in the mail If you have any lab test that is abnormal or we need to change your treatment, we will call you to review the results.   Testing/Procedures: Your physician has requested that you have cardiac CT. Cardiac computed tomography (CT) is a painless test that uses an x-ray machine to take clear, detailed pictures of your heart. For further information please visit [email or web address] Please follow instruction sheet as given.   Follow-Up: At [facility], you and your health needs are our priority.  As part of our continuing mission to provide you with exceptional heart care, we have created designated Provider Care Teams.  These Care Teams include your primary Cardiologist (physician) and Advanced Practice Providers (APPs -  Physician Assistants and Nurse Practitioners) who all work together to provide you with the care you need, when you need it.  We recommend signing up for the patient portal called "MyChart".  Sign up information is provided on this After Visit Summary.  MyChart is used to connect with patients for Virtual Visits (Telemedicine).  Patients are able to view lab/test results, encounter notes, upcoming appointments, etc.  Non-urgent messages can be sent to your provider as well.   To learn more about what you can do with MyChart, go to [email or web address].    Your next appointment:   3 month(s)  The format for your next appointment:   In Person  Provider:   Lennie Odor, MD   Other Instructions Your cardiac CT will be scheduled at one of the below locations:    [facility] [address] [place], [place] [address] [phone]  If scheduled at [facility], please arrive at the [facility] main entrance (entrance A) of [facility] 30 minutes prior to test start time. Proceed to the [facility] Radiology Department (first floor) to check-in and test prep.   Please follow these instructions carefully (unless otherwise directed):  Hold all erectile dysfunction medications at least 3 days (72 hrs) prior to test.  On the Night Before the Test: . Be sure to Drink plenty of water. . Do not consume any caffeinated/decaffeinated beverages or chocolate 12 hours prior to your test. . Do not take any antihistamines 12 hours prior to your test.  On the Day of the Test: . Drink plenty of water until 1 hour prior to the test. . Do not eat any food 4 hours prior to the test. . You may take your regular medications prior to the test.  . Take metoprolol (Lopressor) two hours prior to test. . HOLD Furosemide/Hydrochlorothiazide morning of the test. . FEMALES- please wear underwire-free bra if available      After the Test: . Drink plenty of water. . After receiving IV contrast, you may experience a mild flushed feeling. This is normal. . On occasion, you may experience a mild rash up to 24 hours after the test. This is not dangerous. If this occurs, you can take Benadryl 25 mg and increase your fluid intake. . If you experience trouble breathing, this can be serious. If it  is severe call 911 IMMEDIATELY. If it is mild, please call our office. . If you take any of these medications: Glipizide/Metformin, Avandament, Glucavance, please do not take 48 hours after completing test unless otherwise instructed.   Once we have confirmed authorization from your insurance company, we will call you to set up a date and time for your test. Based on how quickly your insurance processes prior authorizations requests, please allow up to 4  weeks to be contacted for scheduling your Cardiac CT appointment. Be advised that routine Cardiac CT appointments could be scheduled as many as 8 weeks after your provider has ordered it.  For non-scheduling related questions, please contact the cardiac imaging nurse navigator should you have any questions/concerns: Rockwell Alexandria, Cardiac Imaging Nurse Navigator Larey Brick, Cardiac Imaging Nurse Navigator [place] [facility] Direct Office Dial: [phone]   For scheduling needs, including cancellations and rescheduling, please call [place], [phone].

## 2020-03-26 ENCOUNTER — Ambulatory Visit
Admission: RE | Admit: 2020-03-26 | Discharge: 2020-03-26 | Disposition: A | Discharge status: Home / Self Care | Payer: 59 | Source: Ambulatory Visit | Attending: Otolaryngology | Admitting: Otolaryngology

## 2020-03-26 DIAGNOSIS — J329 Chronic sinusitis, unspecified: Secondary | ICD-10-CM

## 2020-04-08 ENCOUNTER — Other Ambulatory Visit: Payer: Self-pay | Admitting: Otolaryngology

## 2020-04-14 ENCOUNTER — Telehealth (HOSPITAL_COMMUNITY): Payer: Self-pay | Admitting: Emergency Medicine

## 2020-04-14 NOTE — Telephone Encounter (Signed)
Attempted to call patient regarding upcoming cardiac CT appointment. °Left message on voicemail with name and callback number °Machi Whittaker RN Navigator Cardiac Imaging °[facility] °[phone] Office °[phone] Cell ° °

## 2020-04-15 ENCOUNTER — Ambulatory Visit (HOSPITAL_COMMUNITY): Payer: 59

## 2020-04-17 ENCOUNTER — Telehealth (HOSPITAL_COMMUNITY): Payer: Self-pay | Admitting: *Deleted

## 2020-04-17 NOTE — Telephone Encounter (Signed)
Attempted to call patient regarding upcoming cardiac CT appointment. °Left message on voicemail with name and callback number ° °Mariana Goytia RN Navigator Cardiac Imaging °[facility] °[phone] Office °[phone] Cell ° °

## 2020-04-20 ENCOUNTER — Ambulatory Visit (HOSPITAL_COMMUNITY)
Admission: RE | Admit: 2020-04-20 | Discharge: 2020-04-20 | Disposition: A | Discharge status: Home / Self Care | Payer: 59 | Source: Ambulatory Visit | Attending: Cardiovascular Disease | Admitting: Cardiovascular Disease

## 2020-04-20 ENCOUNTER — Other Ambulatory Visit: Payer: Self-pay

## 2020-04-20 DIAGNOSIS — I2510 Atherosclerotic heart disease of native coronary artery without angina pectoris: Secondary | ICD-10-CM

## 2020-04-20 DIAGNOSIS — R079 Chest pain, unspecified: Secondary | ICD-10-CM | POA: Diagnosis not present

## 2020-04-20 MED ORDER — NITROGLYCERIN 0.4 MG SL SUBL
SUBLINGUAL_TABLET | SUBLINGUAL | Status: AC
  Filled 2020-04-20: qty 2

## 2020-04-20 MED ORDER — NITROGLYCERIN 0.4 MG SL SUBL
0.8000 mg | SUBLINGUAL_TABLET | Freq: Once | SUBLINGUAL | Status: AC
  Administered 2020-04-20: 0.8 mg via SUBLINGUAL

## 2020-04-20 MED ORDER — IOHEXOL 350 MG/ML SOLN
100.0000 mL | Freq: Once | INTRAVENOUS | Status: AC | PRN
  Administered 2020-04-20: 100 mL via INTRAVENOUS

## 2020-04-22 ENCOUNTER — Other Ambulatory Visit: Payer: Self-pay

## 2020-04-22 DIAGNOSIS — R0989 Other specified symptoms and signs involving the circulatory and respiratory systems: Secondary | ICD-10-CM

## 2020-04-22 MED ORDER — ROSUVASTATIN CALCIUM 20 MG PO TABS: ORAL_TABLET | ORAL | 1 refills | Status: DC

## 2020-04-23 ENCOUNTER — Telehealth: Payer: Self-pay | Admitting: Cardiovascular Disease

## 2020-04-23 ENCOUNTER — Other Ambulatory Visit: Payer: Self-pay

## 2020-04-23 ENCOUNTER — Ambulatory Visit (INDEPENDENT_AMBULATORY_CARE_PROVIDER_SITE_OTHER)
Admission: RE | Admit: 2020-04-23 | Discharge: 2020-04-23 | Disposition: A | Discharge status: Home / Self Care | Payer: 59 | Source: Ambulatory Visit | Attending: Cardiovascular Disease | Admitting: Cardiovascular Disease

## 2020-04-23 DIAGNOSIS — R0989 Other specified symptoms and signs involving the circulatory and respiratory systems: Secondary | ICD-10-CM | POA: Diagnosis not present

## 2020-04-23 MED ORDER — IOHEXOL 350 MG/ML SOLN
100.0000 mL | Freq: Once | INTRAVENOUS | Status: AC | PRN
  Administered 2020-04-23: 100 mL via INTRAVENOUS

## 2020-04-23 NOTE — Telephone Encounter (Signed)
Patient's wife calling for CT results. 

## 2020-04-23 NOTE — Telephone Encounter (Signed)
Spoke with pt regarding chest CT results as well as coronary CTA results. Pt asking about plaque in his arteries according to coronary CTA. Explained to pt what the results say. All questions answered. Pt verbalized understanding. Pt to call back to set up appointment with Dr. Flora Lipps after his office visit was cancelled.

## 2020-04-23 NOTE — Telephone Encounter (Signed)
Patient aware of CTA chest PE protocol appointment today 04/23/20 at 10:00 am at [facility] CT--[address]---arrival time is 9:45 am for check in .

## 2020-06-15 ENCOUNTER — Inpatient Hospital Stay (HOSPITAL_COMMUNITY)
Admission: RE | Admit: 2020-06-15 | Discharge: 2020-06-15 | Disposition: A | Discharge status: Home / Self Care | Payer: 59 | Source: Ambulatory Visit

## 2020-06-15 NOTE — Progress Notes (Signed)
PCP - Dr. Orvan July  Cardiologist -   Chest x-ray -   EKG -   Stress Test -   ECHO -   Cardiac Cath -   AICD- PM- LOOP-  Sleep Study -  CPAP -   LABS- BMP  ASA-na  ERAS-na  HA1C- Fasting Blood Sugar -  Checks Blood Sugar _____ times a day  Anesthesia-  Pt denies having chest pain, sob, or fever at this time. All instructions explained to the pt, with a verbal understanding of the material. Pt agrees to go over the instructions while at home for a better understanding. Pt also instructed to self quarantine after being tested for COVID-19. The opportunity to ask questions was provided.

## 2020-06-15 NOTE — Pre-Procedure Instructions (Signed)
Michael Harrison  06/15/2020     Your procedure is scheduled on Wednesday, June 8.  Report to [facility], Main Entrance or Entrance "A" at 6:30 AM.                Your surgery or procedure is scheduled to begin at 8:30 AM   Call this number if you have problems the morning of surgery: [phone]  This is the number for the Pre- Surgical Desk.                For any other questions, please call [phone], Monday - Friday 8 AM - 4 PM.   Remember:  Do not eat or drink after midnight Tuesday, June 7.    Take these medicines the morning of surgery with A SIP OF WATER: metoprolol tartrate (LOPRESSOR)  pantoprazole (PROTONIX)  rosuvastatin (CRESTOR)   Flonase Nasal Spray - if Dr. Avel Sensor said to.  STOP taking Aspirin, Aspirin Products (Goody Powder, Excedrin Migraine), Ibuprofen (Advil), Naproxen (Aleve), Vitamins and Herbal Products (ie Fish Oil).    Special instructions:    [facility]- Preparing For Surgery  Before surgery, you can play an important role. Because skin is not sterile, your skin needs to be as free of germs as possible. You can reduce the number of germs on your skin by washing with CHG (chlorahexidine gluconate) Soap before surgery.  CHG is an antiseptic cleaner which kills germs and bonds with the skin to continue killing germs even after washing.    Oral Hygiene is also important to reduce your risk of infection.  Remember - BRUSH YOUR TEETH THE MORNING OF SURGERY WITH YOUR REGULAR TOOTHPASTE  Please do not use if you have an allergy to CHG or antibacterial soaps. If your skin becomes reddened/irritated stop using the CHG.  Do not shave (including legs and underarms) for at least 48 hours prior to first CHG shower. It is OK to shave your face.  Please follow these instructions carefully.   1. Shower the NIGHT BEFORE SURGERY and the MORNING OF SURGERY with CHG.   2. If you chose to wash your hair, wash your hair first as usual with your normal  shampoo.  3. After you shampoo, wash your face and private area with the soap you use at home, then rinse your hair and body thoroughly to remove the shampoo and soap.  4. Use CHG as you would any other liquid soap. You can apply CHG directly to the skin and wash gently with a scrungie or a clean washcloth.   5. Apply the CHG Soap to your body ONLY FROM THE NECK DOWN.  Do not use on open wounds or open sores. Avoid contact with your eyes, ears, mouth and genitals (private parts).   6. Wash thoroughly, paying special attention to the area where your surgery will be performed.  7. Thoroughly rinse your body with warm water from the neck down.  8. DO NOT shower/wash with your normal soap after using and rinsing off the CHG Soap.  9. Pat yourself dry with a CLEAN TOWEL.  10. Wear CLEAN PAJAMAS to bed the night before surgery, wear comfortable clothes the morning of surgery  11. Place CLEAN SHEETS on your bed the night of your first shower and DO NOT SLEEP WITH PETS.  Day of Surgery: Shower as instructed above. Do not apply any deodorants/lotions, powders or colognes.  Please wear clean clothes to the hospital/surgery center.   Remember to  brush your teeth WITH YOUR REGULAR TOOTHPASTE.  Do not wear jewelry, make-up or nail polish.  Do not shave 48 hours prior to surgery.  Men may shave face and neck.  Do not bring valuables to the hospital.  [facility] is not responsible for any belongings or valuables.  Contacts, dentures or bridgework may not be worn into surgery.  Leave your suitcase in the car.  After surgery it may be brought to your room.  For patients admitted to the hospital, discharge time will be determined by your treatment team.  Patients discharged the day of surgery will not be allowed to drive home.   Please read over the fact sheets that you were given.

## 2020-07-15 ENCOUNTER — Ambulatory Visit: Payer: 59 | Admitting: Cardiovascular Disease

## 2020-09-29 NOTE — Pre-Procedure Instructions (Signed)
Surgical Instructions    Your procedure is scheduled on Wednesday, September 28th.  Report to [facility] Main Entrance "A" at 5:30 A.M., then check in with the Admitting office.  Call this number if you have problems the morning of surgery:  [phone]   If you have any questions prior to your surgery date call [phone]: Open Monday-Friday 8am-4pm    Remember:  Do not eat or drink after midnight the night before your surgery    Take these medicines the morning of surgery with A SIP OF WATER  fluticasone (FLONASE) pantoprazole (PROTONIX) rosuvastatin (CRESTOR)   As of today, STOP taking any Aspirin (unless otherwise instructed by your surgeon) Aleve, Naproxen, Ibuprofen, Motrin, Advil, Goody's, BC's, all herbal medications, fish oil, and all vitamins.                     Do NOT Smoke (Tobacco/Vaping) or drink Alcohol 24 hours prior to your procedure.  If you use a CPAP at night, you may bring all equipment for your overnight stay.   Contacts, glasses, piercing's, hearing aid's, dentures or partials may not be worn into surgery, please bring cases for these belongings.    For patients admitted to the hospital, discharge time will be determined by your treatment team.   Patients discharged the day of surgery will not be allowed to drive home, and someone needs to stay with them for 24 hours.  ONLY 1 SUPPORT PERSON MAY BE PRESENT WHILE YOU ARE IN SURGERY. IF YOU ARE TO BE ADMITTED ONCE YOU ARE IN YOUR ROOM YOU WILL BE ALLOWED TWO (2) VISITORS.  Minor children may have two parents present. Special consideration for safety and communication needs will be reviewed on a case by case basis.   Special instructions:   [place]- Preparing For Surgery  Before surgery, you can play an important role. Because skin is not sterile, your skin needs to be as free of germs as possible. You can reduce the number of germs on your skin by washing with CHG (chlorahexidine gluconate) Soap  before surgery.  CHG is an antiseptic cleaner which kills germs and bonds with the skin to continue killing germs even after washing.    Oral Hygiene is also important to reduce your risk of infection.  Remember - BRUSH YOUR TEETH THE MORNING OF SURGERY WITH YOUR REGULAR TOOTHPASTE  Please do not use if you have an allergy to CHG or antibacterial soaps. If your skin becomes reddened/irritated stop using the CHG.  Do not shave (including legs and underarms) for at least 48 hours prior to first CHG shower. It is OK to shave your face.  Please follow these instructions carefully.   Shower the NIGHT BEFORE SURGERY and the MORNING OF SURGERY  If you chose to wash your hair, wash your hair first as usual with your normal shampoo.  After you shampoo, rinse your hair and body thoroughly to remove the shampoo.  Use CHG Soap as you would any other liquid soap. You can apply CHG directly to the skin and wash gently with a scrungie or a clean washcloth.   Apply the CHG Soap to your body ONLY FROM THE NECK DOWN.  Do not use on open wounds or open sores. Avoid contact with your eyes, ears, mouth and genitals (private parts). Wash Face and genitals (private parts)  with your normal soap.   Wash thoroughly, paying special attention to the area where your surgery will be performed.  Thoroughly rinse  your body with warm water from the neck down.  DO NOT shower/wash with your normal soap after using and rinsing off the CHG Soap.  Pat yourself dry with a CLEAN TOWEL.  Wear CLEAN PAJAMAS to bed the night before surgery  Place CLEAN SHEETS on your bed the night before your surgery  DO NOT SLEEP WITH PETS.   Day of Surgery: Shower with CHG soap. Do not wear jewelry. Do not wear lotions, powders, colognes, or deodorant. Men may shave face and neck. Do not bring valuables to the hospital. [facility] is not responsible for any belongings or valuables. Wear Clean/Comfortable clothing the morning of  surgery Remember to brush your teeth WITH YOUR REGULAR TOOTHPASTE.   Please read over the following fact sheets that you were given.

## 2020-09-30 ENCOUNTER — Encounter (HOSPITAL_COMMUNITY)
Admission: RE | Admit: 2020-09-30 | Discharge: 2020-09-30 | Disposition: A | Discharge status: Home / Self Care | Payer: 59 | Source: Ambulatory Visit | Attending: Otolaryngology | Admitting: Otolaryngology

## 2020-09-30 ENCOUNTER — Other Ambulatory Visit: Payer: Self-pay

## 2020-09-30 ENCOUNTER — Encounter (HOSPITAL_COMMUNITY): Payer: Self-pay

## 2020-09-30 DIAGNOSIS — Z01812 Encounter for preprocedural laboratory examination: Secondary | ICD-10-CM | POA: Diagnosis present

## 2020-09-30 LAB — CBC
HCT: 47 % (ref 39.0–52.0)
Hemoglobin: 15.2 g/dL (ref 13.0–17.0)
MCH: 28.7 pg (ref 26.0–34.0)
MCHC: 32.3 g/dL (ref 30.0–36.0)
MCV: 88.8 fL (ref 80.0–100.0)
Platelets: 345 10*3/uL (ref 150–400)
RBC: 5.29 MIL/uL (ref 4.22–5.81)
RDW: 13 % (ref 11.5–15.5)
WBC: 5.6 10*3/uL (ref 4.0–10.5)
nRBC: 0 % (ref 0.0–0.2)

## 2020-09-30 LAB — COMPREHENSIVE METABOLIC PANEL
ALT: 19 U/L (ref 0–44)
AST: 19 U/L (ref 15–41)
Albumin: 3.5 g/dL (ref 3.5–5.0)
Alkaline Phosphatase: 73 U/L (ref 38–126)
Anion gap: 8 (ref 5–15)
BUN: 9 mg/dL (ref 6–20)
CO2: 26 mmol/L (ref 22–32)
Calcium: 9.2 mg/dL (ref 8.9–10.3)
Chloride: 104 mmol/L (ref 98–111)
Creatinine, Ser: 0.87 mg/dL (ref 0.61–1.24)
GFR, Estimated: >60 mL/min (ref >60)
Glucose, Bld: 92 mg/dL (ref 70–99)
Potassium: 4.1 mmol/L (ref 3.5–5.1)
Sodium: 138 mmol/L (ref 135–145)
Total Bilirubin: 0.6 mg/dL (ref 0.3–1.2)
Total Protein: 6.8 g/dL (ref 6.5–8.1)

## 2020-09-30 LAB — GLUCOSE, CAPILLARY: Glucose-Capillary: 104 mg/dL — ABNORMAL HIGH (ref 70–99)

## 2020-09-30 NOTE — Progress Notes (Signed)
PCP - Dr. Leodis Sias Cardiologist - denies  PPM/ICD - denies   Chest x-ray - 02/25/20 EKG - 03/24/20 Stress Test - pt states he had one about 6 years ago at [facility] - denies Cardiac Cath - denies  Sleep Study - yes, pt has been diagnosed with sleep apnea CPAP - yes, every night  DM: Pre-diabetic, pt does not check CBG at home  Blood Thinner Instructions: n/a Aspirin Instructions: n/a  ERAS Protcol - No, NPO   COVID TEST- scheduled at PAT for test on 10/05/20   Anesthesia review: No  Patient denies shortness of breath, fever, cough and chest pain at PAT appointment   All instructions explained to the patient, with a verbal understanding of the material. Patient agrees to go over the instructions while at home for a better understanding. The opportunity to ask questions was provided.

## 2020-10-01 LAB — HEMOGLOBIN A1C
Hgb A1c MFr Bld: 5.5 % (ref 4.8–5.6)
Mean Plasma Glucose: 111 mg/dL

## 2020-10-05 ENCOUNTER — Other Ambulatory Visit: Payer: Self-pay

## 2020-10-05 ENCOUNTER — Other Ambulatory Visit (HOSPITAL_COMMUNITY)
Admission: RE | Admit: 2020-10-05 | Discharge: 2020-10-05 | Disposition: A | Discharge status: Home / Self Care | Payer: 59 | Source: Ambulatory Visit | Attending: Otolaryngology | Admitting: Otolaryngology

## 2020-10-05 DIAGNOSIS — Z01812 Encounter for preprocedural laboratory examination: Secondary | ICD-10-CM | POA: Diagnosis present

## 2020-10-05 DIAGNOSIS — Z20822 Contact with and (suspected) exposure to covid-19: Secondary | ICD-10-CM | POA: Diagnosis not present

## 2020-10-05 LAB — SARS CORONAVIRUS 2 (TAT 6-24 HRS): SARS Coronavirus 2: NEGATIVE

## 2020-10-07 ENCOUNTER — Ambulatory Visit (HOSPITAL_COMMUNITY): Payer: 59 | Admitting: Anesthesiology

## 2020-10-07 ENCOUNTER — Ambulatory Visit (HOSPITAL_COMMUNITY)
Admission: RE | Admit: 2020-10-07 | Discharge: 2020-10-07 | Disposition: A | Discharge status: Home / Self Care | Payer: 59 | Attending: Otolaryngology | Admitting: Otolaryngology

## 2020-10-07 ENCOUNTER — Encounter (HOSPITAL_COMMUNITY): Payer: Self-pay | Admitting: Otolaryngology

## 2020-10-07 ENCOUNTER — Encounter (HOSPITAL_COMMUNITY)
Admission: RE | Disposition: A | Discharge status: Home / Self Care | Payer: Self-pay | Source: Home / Self Care | Attending: Otolaryngology

## 2020-10-07 DIAGNOSIS — J324 Chronic pansinusitis: Secondary | ICD-10-CM | POA: Insufficient documentation

## 2020-10-07 DIAGNOSIS — J342 Deviated nasal septum: Secondary | ICD-10-CM | POA: Insufficient documentation

## 2020-10-07 DIAGNOSIS — J3489 Other specified disorders of nose and nasal sinuses: Secondary | ICD-10-CM | POA: Diagnosis not present

## 2020-10-07 DIAGNOSIS — J338 Other polyp of sinus: Secondary | ICD-10-CM | POA: Insufficient documentation

## 2020-10-07 DIAGNOSIS — J339 Nasal polyp, unspecified: Secondary | ICD-10-CM | POA: Diagnosis not present

## 2020-10-07 DIAGNOSIS — J343 Hypertrophy of nasal turbinates: Secondary | ICD-10-CM | POA: Insufficient documentation

## 2020-10-07 HISTORY — PX: SPHENOIDECTOMY: SHX2421

## 2020-10-07 HISTORY — PX: MAXILLARY ANTROSTOMY: SHX2003

## 2020-10-07 HISTORY — PX: FRONTAL SINUS EXPLORATION: SHX6591

## 2020-10-07 HISTORY — PX: NASAL SEPTOPLASTY W/ TURBINOPLASTY: SHX2070

## 2020-10-07 HISTORY — PX: SINUS ENDO WITH FUSION: SHX5329

## 2020-10-07 HISTORY — PX: ETHMOIDECTOMY: SHX5197

## 2020-10-07 LAB — GLUCOSE, CAPILLARY
Glucose-Capillary: 108 mg/dL — ABNORMAL HIGH (ref 70–99)
Glucose-Capillary: 145 mg/dL — ABNORMAL HIGH (ref 70–99)

## 2020-10-07 SURGERY — SURGERY, PARANASAL SINUS, ENDOSCOPIC, WITH NASAL SEPTOPLASTY, TURBINOPLASTY, AND MAXILLARY SINUSOTOMY
Anesthesia: General | Site: Nose | Laterality: Bilateral

## 2020-10-07 MED ORDER — ONDANSETRON HCL 4 MG/2ML IJ SOLN
INTRAMUSCULAR | Status: DC | PRN
  Administered 2020-10-07: 4 mg via INTRAVENOUS

## 2020-10-07 MED ORDER — ORAL CARE MOUTH RINSE: 15.0000 mL | Freq: Once | OROMUCOSAL | Status: AC

## 2020-10-07 MED ORDER — OXYMETAZOLINE HCL 0.05 % NA SOLN
NASAL | Status: AC
  Filled 2020-10-07: qty 30

## 2020-10-07 MED ORDER — OXYCODONE HCL 5 MG PO TABS
ORAL_TABLET | ORAL | Status: AC
  Filled 2020-10-07: qty 1

## 2020-10-07 MED ORDER — ACETAMINOPHEN 10 MG/ML IV SOLN: 1000.0000 mg | Freq: Once | INTRAVENOUS | Status: DC | PRN

## 2020-10-07 MED ORDER — SODIUM CHLORIDE 0.9 % IR SOLN
Status: DC | PRN
  Administered 2020-10-07: 1000 mL

## 2020-10-07 MED ORDER — ROCURONIUM BROMIDE 10 MG/ML (PF) SYRINGE
PREFILLED_SYRINGE | INTRAVENOUS | Status: AC
  Filled 2020-10-07: qty 10

## 2020-10-07 MED ORDER — ACETAMINOPHEN 10 MG/ML IV SOLN
INTRAVENOUS | Status: AC
  Filled 2020-10-07: qty 100

## 2020-10-07 MED ORDER — DEXAMETHASONE SODIUM PHOSPHATE 10 MG/ML IJ SOLN
INTRAMUSCULAR | Status: AC
  Filled 2020-10-07: qty 1

## 2020-10-07 MED ORDER — LIDOCAINE HCL (PF) 2 % IJ SOLN
INTRAMUSCULAR | Status: AC
  Filled 2020-10-07: qty 5

## 2020-10-07 MED ORDER — SUCCINYLCHOLINE CHLORIDE 200 MG/10ML IV SOSY
PREFILLED_SYRINGE | INTRAVENOUS | Status: AC
  Filled 2020-10-07: qty 10

## 2020-10-07 MED ORDER — OXYCODONE-ACETAMINOPHEN 5-325 MG PO TABS: 1.0000 | ORAL_TABLET | ORAL | 0 refills | Status: AC | PRN

## 2020-10-07 MED ORDER — OXYMETAZOLINE HCL 0.05 % NA SOLN
NASAL | Status: DC | PRN
  Administered 2020-10-07 (×2): 1

## 2020-10-07 MED ORDER — LIDOCAINE-EPINEPHRINE (PF) 1 %-1:200000 IJ SOLN
INTRAMUSCULAR | Status: AC
  Filled 2020-10-07: qty 30

## 2020-10-07 MED ORDER — PHENYLEPHRINE 40 MCG/ML (10ML) SYRINGE FOR IV PUSH (FOR BLOOD PRESSURE SUPPORT)
PREFILLED_SYRINGE | INTRAVENOUS | Status: DC | PRN
  Administered 2020-10-07 (×2): 80 ug via INTRAVENOUS

## 2020-10-07 MED ORDER — CEFAZOLIN IN SODIUM CHLORIDE 3-0.9 GM/100ML-% IV SOLN
3.0000 g | INTRAVENOUS | Status: DC
  Filled 2020-10-07: qty 100

## 2020-10-07 MED ORDER — FENTANYL CITRATE (PF) 250 MCG/5ML IJ SOLN
INTRAMUSCULAR | Status: DC | PRN
  Administered 2020-10-07 (×3): 50 ug via INTRAVENOUS
  Administered 2020-10-07: 100 ug via INTRAVENOUS

## 2020-10-07 MED ORDER — HYDROMORPHONE HCL 1 MG/ML IJ SOLN
INTRAMUSCULAR | Status: AC
  Filled 2020-10-07: qty 1

## 2020-10-07 MED ORDER — OXYCODONE HCL 5 MG PO TABS
5.0000 mg | ORAL_TABLET | Freq: Once | ORAL | Status: AC | PRN
Start: 2020-10-07 — End: 2020-10-07
  Administered 2020-10-07: 5 mg via ORAL

## 2020-10-07 MED ORDER — LIDOCAINE-EPINEPHRINE 1 %-1:100000 IJ SOLN: INTRAMUSCULAR | Status: DC | PRN

## 2020-10-07 MED ORDER — LIDOCAINE 2% (20 MG/ML) 5 ML SYRINGE
INTRAMUSCULAR | Status: DC | PRN
  Administered 2020-10-07: 100 mg via INTRAVENOUS

## 2020-10-07 MED ORDER — SUGAMMADEX SODIUM 200 MG/2ML IV SOLN
INTRAVENOUS | Status: DC | PRN
  Administered 2020-10-07: 200 mg via INTRAVENOUS

## 2020-10-07 MED ORDER — ROCURONIUM BROMIDE 10 MG/ML (PF) SYRINGE
PREFILLED_SYRINGE | INTRAVENOUS | Status: DC | PRN
Start: 2020-10-07 — End: 2020-10-07
  Administered 2020-10-07: 60 mg via INTRAVENOUS

## 2020-10-07 MED ORDER — MIDAZOLAM HCL 2 MG/2ML IJ SOLN
INTRAMUSCULAR | Status: AC
  Filled 2020-10-07: qty 2

## 2020-10-07 MED ORDER — HYDROMORPHONE HCL 1 MG/ML IJ SOLN
0.2500 mg | INTRAMUSCULAR | Status: DC | PRN
  Administered 2020-10-07 (×4): 0.5 mg via INTRAVENOUS

## 2020-10-07 MED ORDER — LIDOCAINE-EPINEPHRINE (PF) 1 %-1:200000 IJ SOLN
INTRAMUSCULAR | Status: DC | PRN
  Administered 2020-10-07: 4 mL

## 2020-10-07 MED ORDER — MIDAZOLAM HCL 2 MG/2ML IJ SOLN
INTRAMUSCULAR | Status: DC | PRN
Start: 2020-10-07 — End: 2020-10-07
  Administered 2020-10-07: 2 mg via INTRAVENOUS

## 2020-10-07 MED ORDER — DEXTROSE 5 % IV SOLN
INTRAVENOUS | Status: DC | PRN
  Administered 2020-10-07: 3 g via INTRAVENOUS

## 2020-10-07 MED ORDER — GLYCOPYRROLATE PF 0.2 MG/ML IJ SOSY
PREFILLED_SYRINGE | INTRAMUSCULAR | Status: DC | PRN
  Administered 2020-10-07: .2 mg via INTRAVENOUS

## 2020-10-07 MED ORDER — 0.9 % SODIUM CHLORIDE (POUR BTL) OPTIME
TOPICAL | Status: DC | PRN
  Administered 2020-10-07: 1000 mL

## 2020-10-07 MED ORDER — SUCCINYLCHOLINE CHLORIDE 200 MG/10ML IV SOSY
PREFILLED_SYRINGE | INTRAVENOUS | Status: DC | PRN
  Administered 2020-10-07: 200 mg via INTRAVENOUS

## 2020-10-07 MED ORDER — PHENYLEPHRINE 40 MCG/ML (10ML) SYRINGE FOR IV PUSH (FOR BLOOD PRESSURE SUPPORT)
PREFILLED_SYRINGE | INTRAVENOUS | Status: AC
  Filled 2020-10-07: qty 10

## 2020-10-07 MED ORDER — HYDROMORPHONE HCL 1 MG/ML IJ SOLN
0.2500 mg | INTRAMUSCULAR | Status: DC | PRN
  Administered 2020-10-07: 0.5 mg via INTRAVENOUS

## 2020-10-07 MED ORDER — LIDOCAINE-EPINEPHRINE 1 %-1:100000 IJ SOLN
INTRAMUSCULAR | Status: AC
  Filled 2020-10-07: qty 1

## 2020-10-07 MED ORDER — DEXAMETHASONE SODIUM PHOSPHATE 10 MG/ML IJ SOLN
INTRAMUSCULAR | Status: DC | PRN
  Administered 2020-10-07: 10 mg via INTRAVENOUS

## 2020-10-07 MED ORDER — PROPOFOL 10 MG/ML IV BOLUS
INTRAVENOUS | Status: DC | PRN
  Administered 2020-10-07: 300 mg via INTRAVENOUS
  Administered 2020-10-07: 30 mg via INTRAVENOUS
  Administered 2020-10-07: 20 mg via INTRAVENOUS

## 2020-10-07 MED ORDER — PROPOFOL 10 MG/ML IV BOLUS
INTRAVENOUS | Status: AC
  Filled 2020-10-07: qty 40

## 2020-10-07 MED ORDER — OXYCODONE HCL 5 MG/5ML PO SOLN
5.0000 mg | Freq: Once | ORAL | Status: AC | PRN
Start: 2020-10-07 — End: 2020-10-07

## 2020-10-07 MED ORDER — CHLORHEXIDINE GLUCONATE 0.12 % MT SOLN
15.0000 mL | Freq: Once | OROMUCOSAL | Status: AC
  Administered 2020-10-07: 15 mL via OROMUCOSAL
  Filled 2020-10-07: qty 15

## 2020-10-07 MED ORDER — LACTATED RINGERS IV SOLN: INTRAVENOUS | Status: DC

## 2020-10-07 MED ORDER — ACETAMINOPHEN 10 MG/ML IV SOLN
INTRAVENOUS | Status: DC | PRN
  Administered 2020-10-07: 1000 mg via INTRAVENOUS

## 2020-10-07 MED ORDER — ONDANSETRON HCL 4 MG/2ML IJ SOLN
INTRAMUSCULAR | Status: AC
  Filled 2020-10-07: qty 2

## 2020-10-07 MED ORDER — AMOXICILLIN 875 MG PO TABS: 875.0000 mg | ORAL_TABLET | Freq: Two times a day (BID) | ORAL | 0 refills | Status: AC

## 2020-10-07 MED ORDER — ONDANSETRON HCL 4 MG/2ML IJ SOLN: 4.0000 mg | Freq: Once | INTRAMUSCULAR | Status: DC | PRN

## 2020-10-07 MED ORDER — BACITRACIN ZINC 500 UNIT/GM EX OINT
TOPICAL_OINTMENT | CUTANEOUS | Status: AC
  Filled 2020-10-07: qty 28.35

## 2020-10-07 MED ORDER — FENTANYL CITRATE (PF) 250 MCG/5ML IJ SOLN
INTRAMUSCULAR | Status: AC
  Filled 2020-10-07: qty 5

## 2020-10-07 SURGICAL SUPPLY — 57 items
AGENT HMST KT MTR STRL THRMB (HEMOSTASIS)
BAG COUNTER SPONGE SURGICOUNT (BAG) ×2 IMPLANT
BAG SPNG CNTER NS LX DISP (BAG) ×1
BLADE RAD40 ROTATE 4M 4 5PK (BLADE) IMPLANT
BLADE RAD60 ROTATE M4 4 5PK (BLADE) IMPLANT
BLADE ROTATE TRICUT 4X13 M4 (BLADE) ×2 IMPLANT
BLADE SURG 15 STRL LF DISP TIS (BLADE) IMPLANT
BLADE SURG 15 STRL SS (BLADE)
BLADE TRICUT ROTATE M4 4 5PK (BLADE) IMPLANT
CANISTER SUCT 3000ML PPV (MISCELLANEOUS) ×2 IMPLANT
CNTNR URN SCR LID CUP LEK RST (MISCELLANEOUS) ×1 IMPLANT
COAGULATOR SUCT SWTCH 10FR 6 (ELECTROSURGICAL) ×2 IMPLANT
CONT SPEC 4OZ STRL OR WHT (MISCELLANEOUS) ×2
COVER SURGICAL LIGHT HANDLE (MISCELLANEOUS) IMPLANT
DRAPE HALF SHEET 40X57 (DRAPES) IMPLANT
DRAPE SHEET LG 3/4 BI-LAMINATE (DRAPES) IMPLANT
DRSG NASOPORE 8CM (GAUZE/BANDAGES/DRESSINGS) ×1 IMPLANT
ELECT COATED BLADE 2.86 ST (ELECTRODE) IMPLANT
ELECT REM PT RETURN 9FT ADLT (ELECTROSURGICAL) ×2
ELECTRODE REM PT RTRN 9FT ADLT (ELECTROSURGICAL) ×1 IMPLANT
FILTER ARTHROSCOPY CONVERTOR (FILTER) ×2 IMPLANT
GAUZE 4X4 16PLY ~~LOC~~+RFID DBL (SPONGE) ×2 IMPLANT
GAUZE SPONGE 2X2 8PLY STRL LF (GAUZE/BANDAGES/DRESSINGS) ×1 IMPLANT
GLOVE SURG LTX SZ7.5 (GLOVE) ×2 IMPLANT
GOWN STRL REUS W/ TWL LRG LVL3 (GOWN DISPOSABLE) ×2 IMPLANT
GOWN STRL REUS W/TWL LRG LVL3 (GOWN DISPOSABLE) ×4
KIT BASIN OR (CUSTOM PROCEDURE TRAY) ×2 IMPLANT
KIT TURNOVER KIT B (KITS) ×2 IMPLANT
NDL HYPO 25GX1X1/2 BEV (NEEDLE) ×1 IMPLANT
NDL SPNL 25GX3.5 QUINCKE BL (NEEDLE) ×1 IMPLANT
NEEDLE HYPO 25GX1X1/2 BEV (NEEDLE) ×2 IMPLANT
NEEDLE SPNL 25GX3.5 QUINCKE BL (NEEDLE) ×2 IMPLANT
NS IRRIG 1000ML POUR BTL (IV SOLUTION) ×2 IMPLANT
PACK EENT II TURBAN DRAPE (CUSTOM PROCEDURE TRAY) ×2 IMPLANT
PAD ARMBOARD 7.5X6 YLW CONV (MISCELLANEOUS) ×4 IMPLANT
PENCIL SMOKE EVACUATOR (MISCELLANEOUS) ×2 IMPLANT
POSITIONER HEAD DONUT 9IN (MISCELLANEOUS) ×2 IMPLANT
SOL ANTI FOG 6CC (MISCELLANEOUS) IMPLANT
SOLUTION ANTI FOG 6CC (MISCELLANEOUS)
SPLINT NASAL DOYLE BI-VL (GAUZE/BANDAGES/DRESSINGS) ×2 IMPLANT
SPONGE GAUZE 2X2 STER 10/PKG (GAUZE/BANDAGES/DRESSINGS) ×1
SPONGE NEURO XRAY DETECT 1X3 (DISPOSABLE) ×3 IMPLANT
SURGIFLO W/THROMBIN 8M KIT (HEMOSTASIS) IMPLANT
SUT CHROMIC 4 0 SH 27 (SUTURE) ×2 IMPLANT
SUT PLAIN 4 0 ~~LOC~~ 1 (SUTURE) ×2 IMPLANT
SUT PROLENE 3 0 PS 2 (SUTURE) ×2 IMPLANT
SYR CONTROL 10ML LL (SYRINGE) IMPLANT
TOWEL GREEN STERILE FF (TOWEL DISPOSABLE) ×2 IMPLANT
TRACKER ENT INSTRUMENT (MISCELLANEOUS) ×2 IMPLANT
TRACKER ENT PATIENT (MISCELLANEOUS) ×2 IMPLANT
TRAY ENT MC OR (CUSTOM PROCEDURE TRAY) ×2 IMPLANT
TUBE CONNECTING 12X1/4 (SUCTIONS) ×2 IMPLANT
TUBE SALEM SUMP 16 FR W/ARV (TUBING) ×2 IMPLANT
TUBING EXTENTION W/L.L. (IV SETS) ×2 IMPLANT
TUBING STRAIGHTSHOT EPS 5PK (TUBING) ×2 IMPLANT
WATER STERILE IRR 1000ML POUR (IV SOLUTION) ×2 IMPLANT
YANKAUER SUCT BULB TIP NO VENT (SUCTIONS) ×2 IMPLANT

## 2020-10-07 NOTE — Anesthesia Procedure Notes (Signed)
Procedure Name: Intubation Date/Time: 10/07/2020 7:39 AM Performed by: Shary Decamp, CRNA Pre-anesthesia Checklist: Patient identified, Patient being monitored, Timeout performed, Emergency Drugs available and Suction available Patient Re-evaluated:Patient Re-evaluated prior to induction Oxygen Delivery Method: Circle system utilized Preoxygenation: Pre-oxygenation with 100% oxygen Induction Type: IV induction Ventilation: Mask ventilation without difficulty Laryngoscope Size: Glidescope and 4 Grade View: Grade I Tube type: Oral Tube size: 8.0 mm Number of attempts: 1 Airway Equipment and Method: Stylet Placement Confirmation: ETT inserted through vocal cords under direct vision, positive ETCO2 and breath sounds checked- equal and bilateral Secured at: 21 cm Tube secured with: Tape Dental Injury: Teeth and Oropharynx as per pre-operative assessment  Difficulty Due To: Difficult Airway- due to limited oral opening

## 2020-10-07 NOTE — Op Note (Signed)
DATE OF PROCEDURE: 10/07/2020  OPERATIVE REPORT   SURGEON: Newman Pies, MD   PREOPERATIVE DIAGNOSES:  1. Bilateral chronic pansinusitis and polyposis. 2. Nasal septal deviation.  3. Bilateral inferior turbinate hypertrophy.  3. Chronic nasal obstruction.  POSTOPERATIVE DIAGNOSES:  1. Bilateral chronic pansinusitis and polyposis. 2. Nasal septal deviation.  3. Bilateral inferior turbinate hypertrophy.  3. Chronic nasal obstruction.  PROCEDURE PERFORMED:  1. Bilateral endoscopic total ethmoidectomy and sphenoidotomy and polyp removal. 2. Bilateral endoscopic frontal sinusotomy and polyp removal. 3. Bilateral endoscopic maxillary antrostomy and polyp removal. 4. Septoplasty.  5. Bilateral partial inferior turbinate resection.  6. FUSION stereotactic image guidance.  ANESTHESIA: General endotracheal tube anesthesia.   COMPLICATIONS: None.   ESTIMATED BLOOD LOSS: 500 mL.   INDICATION FOR PROCEDURE: Michael Harrison is a 50 y.o. male with a history of chronic nasal obstruction and bilateral sinonasal polyps.  He also has a history of recurrent sinusitis.  He was previously treated with multiple antibiotics, allergy medications, topical steroid nasal sprays and systemic steroids.  Despite his treatment, he continued to be symptomatic.  His most recent CT scan showed pansinusitis with polypoid tissue in all paranasal sinuses.  His bilateral sphenoidal ethmoidal recesses and bilateral maxillary infundibula were all obstructed.  He also has significant nasal septal deviation and bilateral inferior turbinate hypertrophy.  Based on the above findings, the decision was made for the patient to undergo the above-stated procedures. The risks, benefits, alternatives, and details of the procedures were discussed with the patient. Questions were invited and answered. Informed consent was obtained.   DESCRIPTION OF PROCEDURE: The patient was taken to the operating room and placed supine on the operating  table. General endotracheal tube anesthesia was administered by the anesthesiologist. The patient was positioned, and prepped and draped in the standard fashion for nasal surgery. Pledgets soaked with Afrin were placed in both nasal cavities for decongestion. The pledgets were subsequently removed. The FUSION stereotactic image guidance marker was placed. The image guidance system was functional throughout the case.  Examination of the nasal cavity revealed a severe nasal septal deviation. 1% lidocaine with 1:100,000 epinephrine was injected onto the nasal septum bilaterally. A hemitransfixion incision was made on the left side. The mucosal flap was carefully elevated on the left side. A cartilaginous incision was made 1 cm superior to the caudal margin of the nasal septum. Mucosal flap was also elevated on the right side in the similar fashion. It should be noted that due to the severe septal deviation, the deviated portion of the cartilaginous and bony septum had to be removed in piecemeal fashion. Once the deviated portions were removed, a straight midline septum was achieved. The septum was then quilted with 4-0 plain gut sutures. The hemitransfixion incision was closed with interrupted 4-0 chromic sutures.   The inferior one half of both hypertrophied inferior turbinate was crossclamped with a Kelly clamp. The inferior one half of each inferior turbinate was then resected with a pair of cross cutting scissors. Hemostasis was achieved with a suction cautery device.   Using a 0 endoscope, the left nasal cavity was examined. A large polypoid middle turbinate was noted. Using Tru-Cut forceps, the inferior one third and medial one half of the middle turbinate was resected. Polypoid tissue was noted within the middle meatus. The polypoid tissue was removed using a combination of microdebrider and Blakesley forceps. The uncinate process was resected with a freer elevator. The maxillary antrum was entered and  enlarged using a combination of backbiter and  microdebrider. Polypoid tissue was removed from the left maxillary sinus.  Attention was then focused on the ethmoid sinuses. The bony partitions of the anterior and posterior ethmoid cavities were taken down. Polypoid tissue was noted and removed.  More polyps were noted to obstruct the left sphenoid opening. The polyps were removed. The sphenoid opening was entered and enlarged. More polyps were removed from the sphenoid sinus. Attention was then focused on the frontal sinus. The frontal recess was identified and enlarged by removing the surrounding bony partitions. Polypoid tissue was removed from the frontal recess. All 4 paranasal sinuses were copiously irrigated with saline solution.  The same procedure was repeated on the right side without exception. More polyps were noted on the right side. All polyps were removed. Doyle splints were applied to the nasal septum.  The care of the patient was turned over to the anesthesiologist. The patient was awakened from anesthesia without difficulty. The patient was extubated and transferred to the recovery room in good condition.   OPERATIVE FINDINGS: Severe nasal septal deviation and bilateral inferior turbinate hypertrophy. Bilateral chronic pansinusitis and polyposis.  SPECIMEN: Bilateral sinus contents.   FOLLOWUP CARE: The patient be discharged home once he is awake and alert. The patient will be placed on Percocet p.r.n. pain, and amoxicillin 875 mg p.o. b.i.d. for 3 days. The patient will follow up in my office in 2 days for splint removal.   Michael Lucien Philomena Doheny, MD

## 2020-10-07 NOTE — Discharge Instructions (Signed)

## 2020-10-07 NOTE — H&P (Signed)
Cc: Chronic nasal obstruction, bilateral sinonasal polyps, chronic sinusitis  HPI: The patient is a 50 year old male who returns today for his follow-up evaluation. The patient was last seen for chronic nasal obstruction and bilateral sinonasal polyps.  He also has a history of recurrent sinusitis.  He was previously treated with multiple antibiotics, allergy medications, topical steroid nasal sprays and systemic steroids.  Despite his treatment, he continues to be symptomatic.  His most recent CT scan showed pansinusitis with polypoid tissue in all paranasal sinuses.  His bilateral sphenoidal ethmoidal recesses and bilateral maxillary infundibula are all obstructed.  He also has significant nasal septal deviation and bilateral inferior turbinate hypertrophy.   Exam: General: Communicates without difficulty, well nourished, no acute distress. Head: Normocephalic, no evidence injury, no tenderness, facial buttresses intact without stepoff. Face/sinus: No tenderness to palpation and percussion. Facial movement is normal and symmetric. Eyes: PERRL, EOMI. No scleral icterus, conjunctivae clear. Neuro: CN II exam reveals vision grossly intact.  No nystagmus at any point of gaze. Ears: Auricles well formed without lesions.  Ear canals are intact without mass or lesion.  No erythema or edema is appreciated.  The TMs are intact without fluid. Nose: External evaluation reveals normal support and skin without lesions.  Dorsum is intact.  Anterior rhinoscopy reveals congested mucosa over anterior aspect of inferior turbinates and intact septum.  No purulence noted. Oral:  Oral cavity and oropharynx are intact, symmetric, without erythema or edema.  Mucosa is moist without lesions. Neck: Full range of motion without pain.  There is no significant lymphadenopathy.  No masses palpable.  Thyroid bed within normal limits to palpation.  Parotid glands and submandibular glands equal bilaterally without mass.  Trachea is  midline. Neuro:  CN 2-12 grossly intact. Gait normal. A flexible scope was inserted into the right nasal cavity.  Endoscopy of the interior nasal cavity, superior, inferior, and middle meatus was performed. The sphenoid-ethmoid recess was examined. Edematous mucosa was noted.  Polypoid tissue noted. Severe nasal septal deviation. Nasopharynx was clear.  Turbinates were hypertrophied but without mass. The procedure was repeated on the contralateral side with similar findings.  The patient tolerated the procedure well.  Assessment: 1.  Chronic pansinusitis with bilateral sinonasal polyps, involving all four pairs of paranasal sinuses.  2.  Significant nasal septal deviation and bilateral inferior turbinate hypertrophy.  3.  The patient has not responded to medical treatments including antibiotics, topical and systemic steroids, and allergy medications.   Plan: 1.  The nasal endoscopy findings and the CT results are reviewed with the patient and his wife.   2.  Continue with Flonase nasal spray daily.  3.  In light of his persistent symptoms, he will benefit from undergoing bilateral endoscopic sinus surgery, septoplasty, and bilateral turbinate reduction.  The risks, benefits, alternatives and details of the procedures are reviewed with the patient.   4.  The patient would like to proceed with the procedures.

## 2020-10-07 NOTE — Anesthesia Postprocedure Evaluation (Signed)
Anesthesia Post Note  Patient: Michael Harrison  Procedure(s) Performed: SINUS ENDO WITH FUSION (Bilateral: Nose) NASAL SEPTOPLASTY WITH TURBINATE REDUCTION (Bilateral: Nose) MAXILLARY ANTROSTOMY WITH TISSUE REMOVAL (Bilateral: Nose) FRONTAL SINUS EXPLORATION (Bilateral: Nose) ETHMOIDECTOMY (Bilateral: Nose) SPHENOIDECTOMY WITH TISSUE REMOVAL (Bilateral: Nose)     Patient location during evaluation: PACU Anesthesia Type: General Level of consciousness: awake and alert Pain management: pain level controlled Vital Signs Assessment: post-procedure vital signs reviewed and stable Respiratory status: spontaneous breathing, nonlabored ventilation, respiratory function stable and patient connected to nasal cannula oxygen Cardiovascular status: blood pressure returned to baseline and stable Postop Assessment: no apparent nausea or vomiting Anesthetic complications: no   No notable events documented.  Last Vitals:  Vitals:   10/07/20 1045 10/07/20 1055  BP: 115/72 121/73  Pulse: 66 (!) 55  Resp: 15 11  Temp: (!) 36.1 C   SpO2: 93% 93%    Last Pain:  Vitals:   10/07/20 1045  TempSrc:   PainSc: 5                  Remell Giaimo S

## 2020-10-07 NOTE — Anesthesia Preprocedure Evaluation (Signed)
Anesthesia Evaluation  Patient identified by MRN, date of birth, ID band Patient awake    Reviewed: Allergy & Precautions, NPO status , Patient's Chart, lab work & pertinent test results  Airway Mallampati: III  TM Distance: <3 FB Neck ROM: Full    Dental no notable dental hx.    Pulmonary sleep apnea and Continuous Positive Airway Pressure Ventilation ,    Pulmonary exam normal breath sounds clear to auscultation       Cardiovascular hypertension, Normal cardiovascular exam Rhythm:Regular Rate:Normal     Neuro/Psych negative neurological ROS  negative psych ROS   GI/Hepatic negative GI ROS, Neg liver ROS,   Endo/Other  Morbid obesity  Renal/GU negative Renal ROS  negative genitourinary   Musculoskeletal negative musculoskeletal ROS (+)   Abdominal   Peds negative pediatric ROS (+)  Hematology negative hematology ROS (+)   Anesthesia Other Findings   Reproductive/Obstetrics negative OB ROS                             Anesthesia Physical Anesthesia Plan  ASA: 3  Anesthesia Plan: General   Post-op Pain Management:    Induction: Intravenous  PONV Risk Score and Plan: 2 and Ondansetron, Dexamethasone and Treatment may vary due to age or medical condition  Airway Management Planned: Oral ETT  Additional Equipment:   Intra-op Plan:   Post-operative Plan: Extubation in OR  Informed Consent: I have reviewed the patients History and Physical, chart, labs and discussed the procedure including the risks, benefits and alternatives for the proposed anesthesia with the patient or authorized representative who has indicated his/her understanding and acceptance.     Dental advisory given  Plan Discussed with: CRNA and Surgeon  Anesthesia Plan Comments:         Anesthesia Quick Evaluation

## 2020-10-07 NOTE — Transfer of Care (Signed)
Immediate Anesthesia Transfer of Care Note  Patient: Michael Harrison  Procedure(s) Performed: SINUS ENDO WITH FUSION (Bilateral: Nose) NASAL SEPTOPLASTY WITH TURBINATE REDUCTION (Bilateral: Nose) MAXILLARY ANTROSTOMY WITH TISSUE REMOVAL (Bilateral: Nose) FRONTAL SINUS EXPLORATION (Bilateral: Nose) ETHMOIDECTOMY (Bilateral: Nose) SPHENOIDECTOMY WITH TISSUE REMOVAL (Bilateral: Nose)  Patient Location: PACU  Anesthesia Type:General  Level of Consciousness: awake, patient cooperative and responds to stimulation  Airway & Oxygen Therapy: Patient Spontanous Breathing and Patient connected to face mask oxygen  Post-op Assessment: Report given to RN, Post -op Vital signs reviewed and stable and Patient moving all extremities X 4  Post vital signs: Reviewed and stable  Last Vitals:  Vitals Value Taken Time  BP 151/97 10/07/20 0945  Temp    Pulse 84 10/07/20 0947  Resp 16 10/07/20 0947  SpO2 100 % 10/07/20 0947  Vitals shown include unvalidated device data.  Last Pain:  Vitals:   10/07/20 0626  TempSrc:   PainSc: 0-No pain      Patients Stated Pain Goal: 0 (10/07/20 0626)  Complications: No notable events documented.

## 2020-10-08 ENCOUNTER — Encounter (HOSPITAL_COMMUNITY): Payer: Self-pay | Admitting: Otolaryngology

## 2020-10-08 LAB — SURGICAL PATHOLOGY

## 2020-10-24 ENCOUNTER — Other Ambulatory Visit: Payer: Self-pay | Admitting: Cardiovascular Disease

## 2021-03-30 DIAGNOSIS — M9904 Segmental and somatic dysfunction of sacral region: Secondary | ICD-10-CM | POA: Diagnosis not present

## 2021-03-30 DIAGNOSIS — M9905 Segmental and somatic dysfunction of pelvic region: Secondary | ICD-10-CM | POA: Diagnosis not present

## 2021-03-30 DIAGNOSIS — M9903 Segmental and somatic dysfunction of lumbar region: Secondary | ICD-10-CM | POA: Diagnosis not present

## 2021-03-30 DIAGNOSIS — M62830 Muscle spasm of back: Secondary | ICD-10-CM | POA: Diagnosis not present

## 2021-03-31 DIAGNOSIS — M9903 Segmental and somatic dysfunction of lumbar region: Secondary | ICD-10-CM | POA: Diagnosis not present

## 2021-03-31 DIAGNOSIS — M62830 Muscle spasm of back: Secondary | ICD-10-CM | POA: Diagnosis not present

## 2021-03-31 DIAGNOSIS — M9904 Segmental and somatic dysfunction of sacral region: Secondary | ICD-10-CM | POA: Diagnosis not present

## 2021-03-31 DIAGNOSIS — M9905 Segmental and somatic dysfunction of pelvic region: Secondary | ICD-10-CM | POA: Diagnosis not present

## 2021-04-01 DIAGNOSIS — M9903 Segmental and somatic dysfunction of lumbar region: Secondary | ICD-10-CM | POA: Diagnosis not present

## 2021-04-01 DIAGNOSIS — M9905 Segmental and somatic dysfunction of pelvic region: Secondary | ICD-10-CM | POA: Diagnosis not present

## 2021-04-01 DIAGNOSIS — M62830 Muscle spasm of back: Secondary | ICD-10-CM | POA: Diagnosis not present

## 2021-04-01 DIAGNOSIS — M9904 Segmental and somatic dysfunction of sacral region: Secondary | ICD-10-CM | POA: Diagnosis not present

## 2021-04-05 DIAGNOSIS — M9904 Segmental and somatic dysfunction of sacral region: Secondary | ICD-10-CM | POA: Diagnosis not present

## 2021-04-05 DIAGNOSIS — M9905 Segmental and somatic dysfunction of pelvic region: Secondary | ICD-10-CM | POA: Diagnosis not present

## 2021-04-05 DIAGNOSIS — M9903 Segmental and somatic dysfunction of lumbar region: Secondary | ICD-10-CM | POA: Diagnosis not present

## 2021-04-05 DIAGNOSIS — M62830 Muscle spasm of back: Secondary | ICD-10-CM | POA: Diagnosis not present

## 2021-04-08 DIAGNOSIS — M9903 Segmental and somatic dysfunction of lumbar region: Secondary | ICD-10-CM | POA: Diagnosis not present

## 2021-04-08 DIAGNOSIS — M62830 Muscle spasm of back: Secondary | ICD-10-CM | POA: Diagnosis not present

## 2021-04-08 DIAGNOSIS — M9905 Segmental and somatic dysfunction of pelvic region: Secondary | ICD-10-CM | POA: Diagnosis not present

## 2021-04-08 DIAGNOSIS — M9904 Segmental and somatic dysfunction of sacral region: Secondary | ICD-10-CM | POA: Diagnosis not present

## 2021-04-14 DIAGNOSIS — R102 Pelvic and perineal pain: Secondary | ICD-10-CM | POA: Diagnosis not present

## 2021-04-14 DIAGNOSIS — Z8042 Family history of malignant neoplasm of prostate: Secondary | ICD-10-CM | POA: Diagnosis not present

## 2021-05-18 DIAGNOSIS — Z131 Encounter for screening for diabetes mellitus: Secondary | ICD-10-CM | POA: Diagnosis not present

## 2021-05-18 DIAGNOSIS — E669 Obesity, unspecified: Secondary | ICD-10-CM | POA: Diagnosis not present

## 2021-05-18 DIAGNOSIS — Z13220 Encounter for screening for lipoid disorders: Secondary | ICD-10-CM | POA: Diagnosis not present

## 2021-05-18 DIAGNOSIS — Z1329 Encounter for screening for other suspected endocrine disorder: Secondary | ICD-10-CM | POA: Diagnosis not present

## 2021-05-18 DIAGNOSIS — Z13228 Encounter for screening for other metabolic disorders: Secondary | ICD-10-CM | POA: Diagnosis not present

## 2021-07-22 DIAGNOSIS — M7732 Calcaneal spur, left foot: Secondary | ICD-10-CM | POA: Diagnosis not present

## 2021-07-22 DIAGNOSIS — M24572 Contracture, left ankle: Secondary | ICD-10-CM | POA: Diagnosis not present

## 2021-07-22 DIAGNOSIS — M792 Neuralgia and neuritis, unspecified: Secondary | ICD-10-CM | POA: Diagnosis not present

## 2021-07-22 DIAGNOSIS — M722 Plantar fascial fibromatosis: Secondary | ICD-10-CM | POA: Diagnosis not present

## 2021-07-30 IMAGING — CT CT MAXILLOFACIAL W/O CM
3 of 5 series · 13 of 47 positions shown, 15 images · non-contrast
Comparison: None.

CLINICAL DATA: Chronic sinusitis.  Polyp seen on exam.

EXAM:
CT MAXILLOFACIAL WITHOUT CONTRAST
TECHNIQUE: Multidetector CT imaging of the maxillofacial structures was
performed. Multiplanar CT image reconstructions were also generated.

[Series 4: sinus 2.00 hr60 s3 cor · coronal · 0.25mm/px · 3 of 113 slices shown]
[im 38/113  bone]
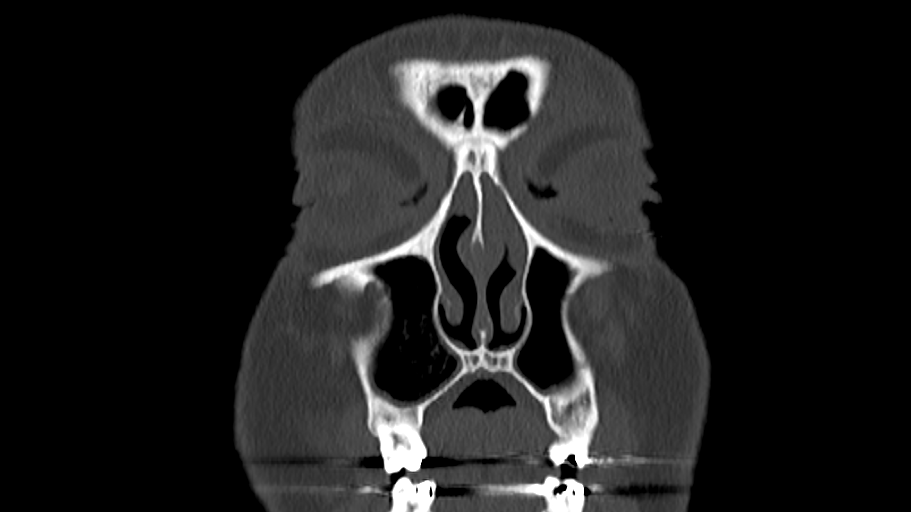
[im 50/113  bone]
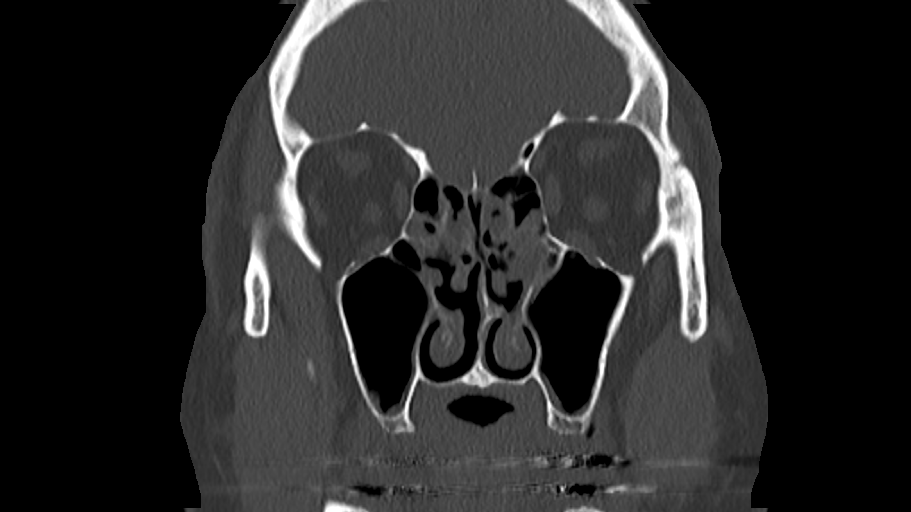
[im 63/113  bone]
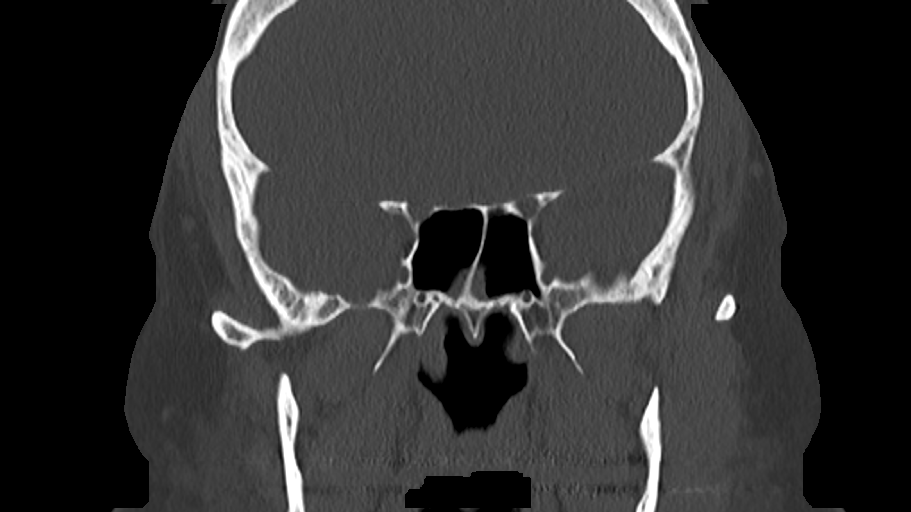

[Series 6: sinus 2.00 hr60 s3 sag · sagittal · 0.25mm/px · 3 of 113 slices shown]
[im 38/113  bone]
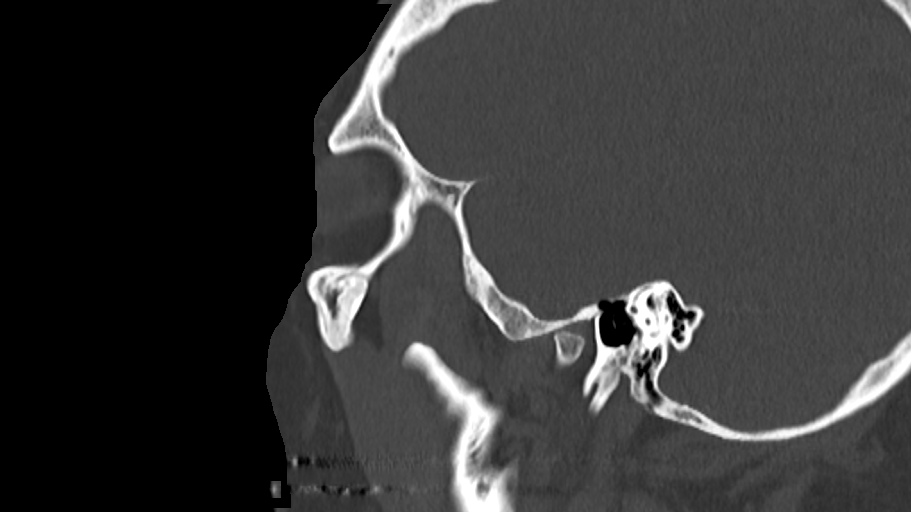
[im 57/113  bone]
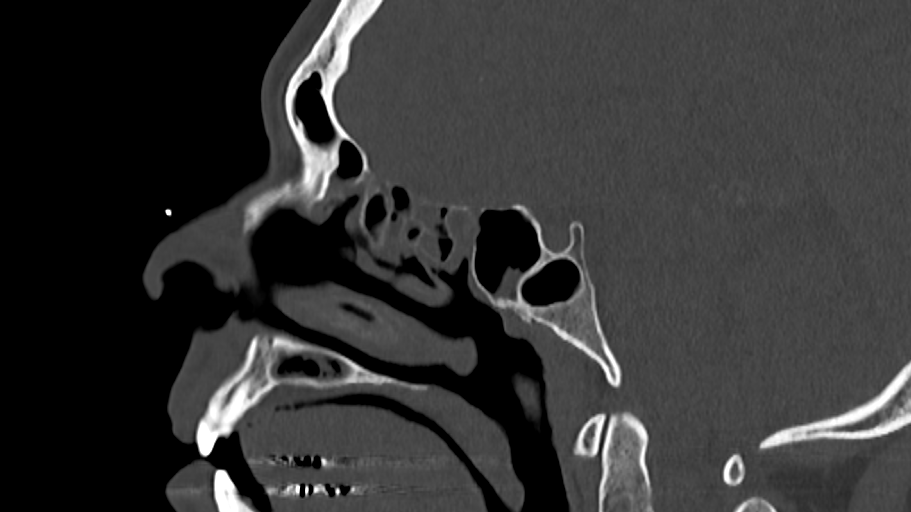
[im 75/113  bone]
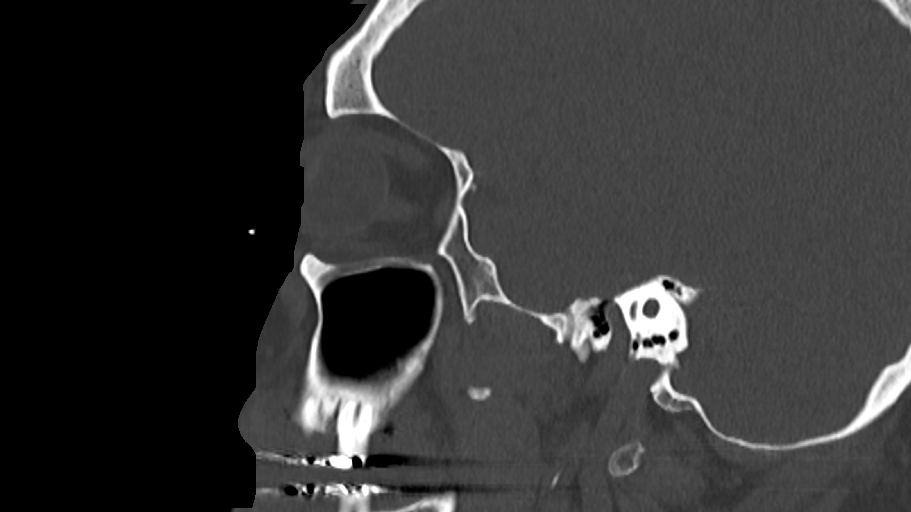

[Series 10: sinus 0.60 hr60 s3 axial fusion thins · axial · 0.44mm/px · z∈[-648,-550]mm · 7 of 212 slices shown, 9 images]
[im 24/212  brain]
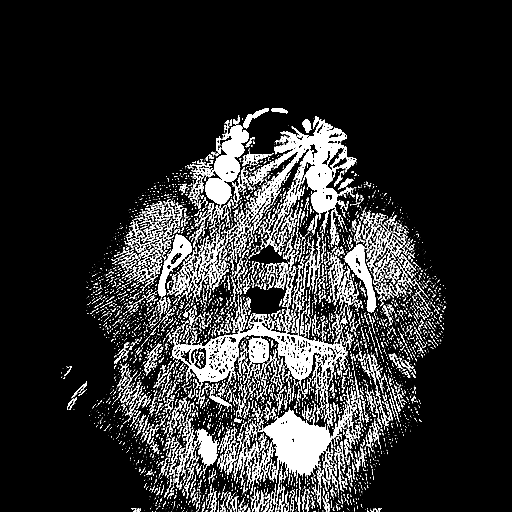
[im 24/212  bone]
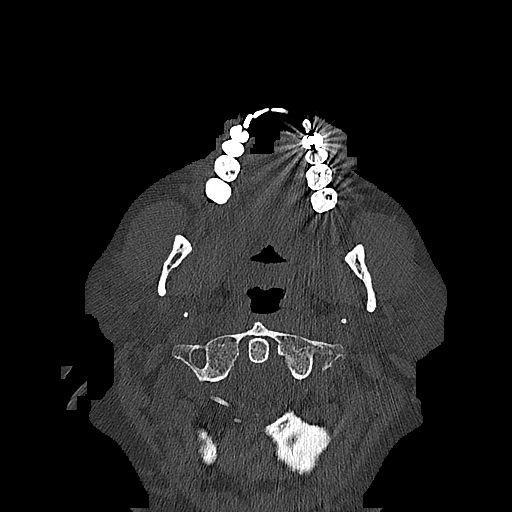
[im 47/212  bone]
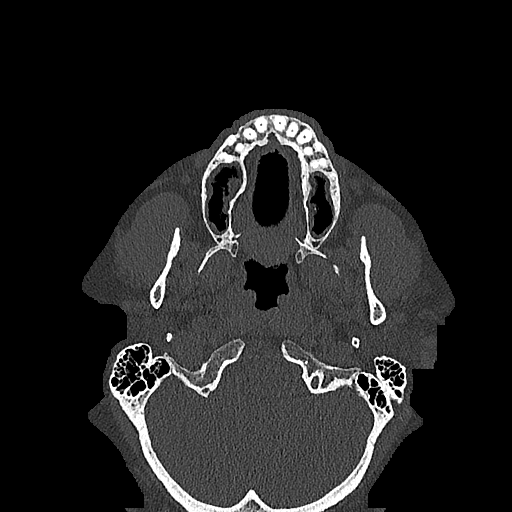
[im 83/212  bone]
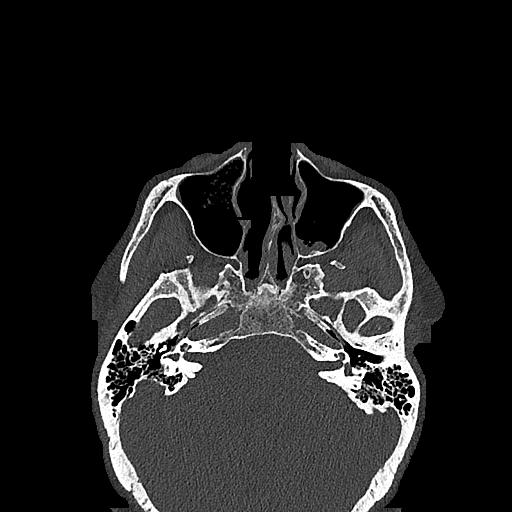
[im 106/212  bone]
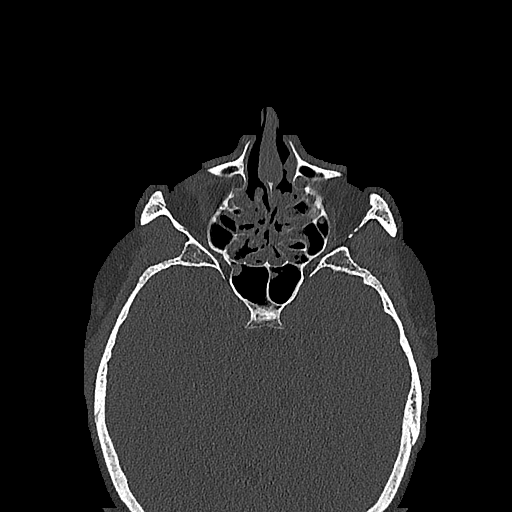
[im 129/212  brain]
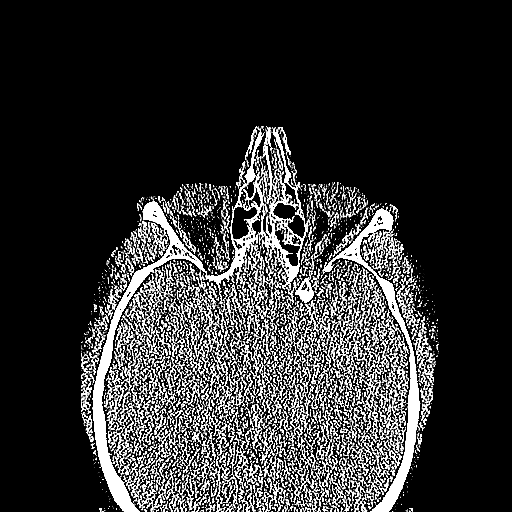
[im 129/212  bone]
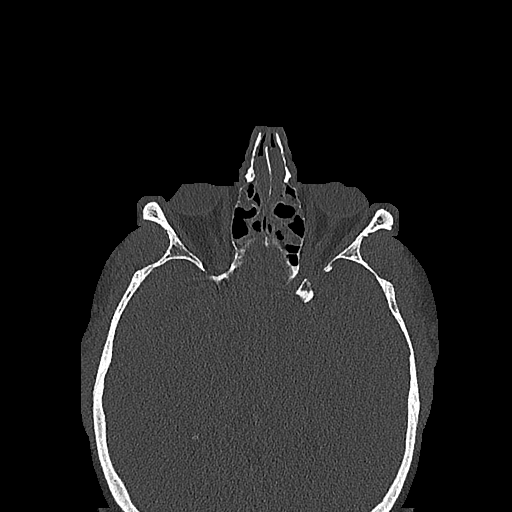
[im 165/212  bone]
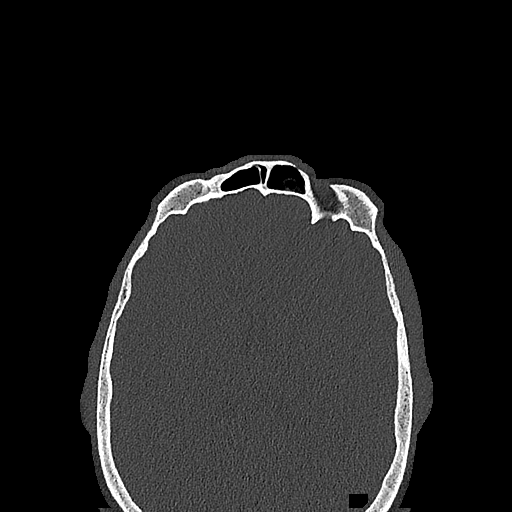
[im 188/212  bone]
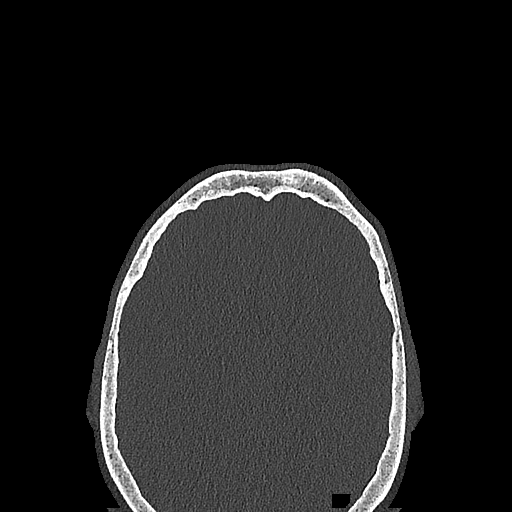

[13 of 47 positions shown; findings below may reference images not displayed]

FINDINGS: Osseous: No fracture or mandibular dislocation. No destructive
process.

Orbits: Negative. No traumatic or inflammatory finding.

Sinuses: Frothy secretions in the right maxillary sinus with small
amount of fluid layering in the left maxillary sinus. Mucosal
thickening scattered opacification of ethmoid air cells.
Additionally, mucosal thickening of the frontal sinuses and sphenoid
sinuses. Opacified bilateral sphenoid ethmoidal recesses and
bilateral maxillary infundibula.

Nasal cavity: Polypoid soft tissue masses in the nasal cavity
bilaterally with opacified olfactory clefts. Mild leftward nasal
septal deviation with bony spur.

Soft tissues: Unremarkable soft tissues the face.

Limited intracranial: No significant or unexpected finding.
IMPRESSION: 1. Pansinus disease, detailed above and greatest in the ethmoid air
cells.
2. Polypoid soft tissue masses in the nasal cavity bilaterally with
opacified olfactory clefts. Findings most likely relate to polyps
and fluid, but recommend correlation with direct inspection to
exclude malignancy.

## 2021-11-11 DIAGNOSIS — M1711 Unilateral primary osteoarthritis, right knee: Secondary | ICD-10-CM | POA: Diagnosis not present

## 2022-01-19 DIAGNOSIS — Z0000 Encounter for general adult medical examination without abnormal findings: Secondary | ICD-10-CM | POA: Diagnosis not present

## 2022-01-19 DIAGNOSIS — Z13220 Encounter for screening for lipoid disorders: Secondary | ICD-10-CM | POA: Diagnosis not present

## 2022-01-19 DIAGNOSIS — R799 Abnormal finding of blood chemistry, unspecified: Secondary | ICD-10-CM | POA: Diagnosis not present

## 2022-01-19 DIAGNOSIS — Z125 Encounter for screening for malignant neoplasm of prostate: Secondary | ICD-10-CM | POA: Diagnosis not present

## 2022-02-02 DIAGNOSIS — G4733 Obstructive sleep apnea (adult) (pediatric): Secondary | ICD-10-CM | POA: Diagnosis not present

## 2022-05-09 DIAGNOSIS — M9903 Segmental and somatic dysfunction of lumbar region: Secondary | ICD-10-CM | POA: Diagnosis not present

## 2022-05-09 DIAGNOSIS — M62830 Muscle spasm of back: Secondary | ICD-10-CM | POA: Diagnosis not present

## 2022-05-09 DIAGNOSIS — M9905 Segmental and somatic dysfunction of pelvic region: Secondary | ICD-10-CM | POA: Diagnosis not present

## 2022-05-09 DIAGNOSIS — M9904 Segmental and somatic dysfunction of sacral region: Secondary | ICD-10-CM | POA: Diagnosis not present

## 2022-05-10 DIAGNOSIS — M9903 Segmental and somatic dysfunction of lumbar region: Secondary | ICD-10-CM | POA: Diagnosis not present

## 2022-05-10 DIAGNOSIS — M9905 Segmental and somatic dysfunction of pelvic region: Secondary | ICD-10-CM | POA: Diagnosis not present

## 2022-05-10 DIAGNOSIS — M9904 Segmental and somatic dysfunction of sacral region: Secondary | ICD-10-CM | POA: Diagnosis not present

## 2022-05-10 DIAGNOSIS — M5432 Sciatica, left side: Secondary | ICD-10-CM | POA: Diagnosis not present

## 2022-05-12 DIAGNOSIS — M5432 Sciatica, left side: Secondary | ICD-10-CM | POA: Diagnosis not present

## 2022-05-12 DIAGNOSIS — M9905 Segmental and somatic dysfunction of pelvic region: Secondary | ICD-10-CM | POA: Diagnosis not present

## 2022-05-12 DIAGNOSIS — M9904 Segmental and somatic dysfunction of sacral region: Secondary | ICD-10-CM | POA: Diagnosis not present

## 2022-05-12 DIAGNOSIS — M9903 Segmental and somatic dysfunction of lumbar region: Secondary | ICD-10-CM | POA: Diagnosis not present

## 2022-05-17 DIAGNOSIS — M5432 Sciatica, left side: Secondary | ICD-10-CM | POA: Diagnosis not present

## 2022-05-17 DIAGNOSIS — M9903 Segmental and somatic dysfunction of lumbar region: Secondary | ICD-10-CM | POA: Diagnosis not present

## 2022-05-17 DIAGNOSIS — M9905 Segmental and somatic dysfunction of pelvic region: Secondary | ICD-10-CM | POA: Diagnosis not present

## 2022-05-17 DIAGNOSIS — M9904 Segmental and somatic dysfunction of sacral region: Secondary | ICD-10-CM | POA: Diagnosis not present

## 2022-05-18 DIAGNOSIS — M9903 Segmental and somatic dysfunction of lumbar region: Secondary | ICD-10-CM | POA: Diagnosis not present

## 2022-05-18 DIAGNOSIS — M9905 Segmental and somatic dysfunction of pelvic region: Secondary | ICD-10-CM | POA: Diagnosis not present

## 2022-05-18 DIAGNOSIS — M9904 Segmental and somatic dysfunction of sacral region: Secondary | ICD-10-CM | POA: Diagnosis not present

## 2022-05-18 DIAGNOSIS — M5432 Sciatica, left side: Secondary | ICD-10-CM | POA: Diagnosis not present

## 2022-05-19 DIAGNOSIS — M5432 Sciatica, left side: Secondary | ICD-10-CM | POA: Diagnosis not present

## 2022-05-19 DIAGNOSIS — M9905 Segmental and somatic dysfunction of pelvic region: Secondary | ICD-10-CM | POA: Diagnosis not present

## 2022-05-19 DIAGNOSIS — M9903 Segmental and somatic dysfunction of lumbar region: Secondary | ICD-10-CM | POA: Diagnosis not present

## 2022-05-19 DIAGNOSIS — M9904 Segmental and somatic dysfunction of sacral region: Secondary | ICD-10-CM | POA: Diagnosis not present

## 2022-05-24 DIAGNOSIS — M9903 Segmental and somatic dysfunction of lumbar region: Secondary | ICD-10-CM | POA: Diagnosis not present

## 2022-05-24 DIAGNOSIS — M9905 Segmental and somatic dysfunction of pelvic region: Secondary | ICD-10-CM | POA: Diagnosis not present

## 2022-05-24 DIAGNOSIS — M9904 Segmental and somatic dysfunction of sacral region: Secondary | ICD-10-CM | POA: Diagnosis not present

## 2022-05-24 DIAGNOSIS — M5432 Sciatica, left side: Secondary | ICD-10-CM | POA: Diagnosis not present

## 2022-06-02 DIAGNOSIS — M5432 Sciatica, left side: Secondary | ICD-10-CM | POA: Diagnosis not present

## 2022-06-02 DIAGNOSIS — M9903 Segmental and somatic dysfunction of lumbar region: Secondary | ICD-10-CM | POA: Diagnosis not present

## 2022-06-02 DIAGNOSIS — M9905 Segmental and somatic dysfunction of pelvic region: Secondary | ICD-10-CM | POA: Diagnosis not present

## 2022-06-02 DIAGNOSIS — M9904 Segmental and somatic dysfunction of sacral region: Secondary | ICD-10-CM | POA: Diagnosis not present

## 2022-06-09 DIAGNOSIS — M9905 Segmental and somatic dysfunction of pelvic region: Secondary | ICD-10-CM | POA: Diagnosis not present

## 2022-06-09 DIAGNOSIS — M9903 Segmental and somatic dysfunction of lumbar region: Secondary | ICD-10-CM | POA: Diagnosis not present

## 2022-06-09 DIAGNOSIS — M5432 Sciatica, left side: Secondary | ICD-10-CM | POA: Diagnosis not present

## 2022-06-09 DIAGNOSIS — M9904 Segmental and somatic dysfunction of sacral region: Secondary | ICD-10-CM | POA: Diagnosis not present

## 2022-09-01 ENCOUNTER — Encounter: Payer: Self-pay | Admitting: Internal Medicine

## 2022-09-14 ENCOUNTER — Telehealth: Payer: Self-pay

## 2022-09-14 NOTE — Telephone Encounter (Signed)
Mutiple attempts made to complete PV. Unable to reach patient. VM left. Pt to call the office back by 5 PM to have PV rescheduled. Pt made aware that in the event that we do not hear back from them their PV and scheduled procedure will be cancelled.  

## 2022-09-19 DIAGNOSIS — J189 Pneumonia, unspecified organism: Secondary | ICD-10-CM | POA: Diagnosis not present

## 2022-09-19 DIAGNOSIS — D509 Iron deficiency anemia, unspecified: Secondary | ICD-10-CM | POA: Diagnosis not present

## 2022-09-19 DIAGNOSIS — E782 Mixed hyperlipidemia: Secondary | ICD-10-CM | POA: Diagnosis not present

## 2022-09-19 DIAGNOSIS — R062 Wheezing: Secondary | ICD-10-CM | POA: Diagnosis not present

## 2022-10-04 ENCOUNTER — Encounter: Payer: Self-pay | Admitting: Internal Medicine

## 2023-01-23 DIAGNOSIS — D509 Iron deficiency anemia, unspecified: Secondary | ICD-10-CM | POA: Diagnosis not present

## 2023-01-23 DIAGNOSIS — G720 Drug-induced myopathy: Secondary | ICD-10-CM | POA: Diagnosis not present

## 2023-01-23 DIAGNOSIS — G4733 Obstructive sleep apnea (adult) (pediatric): Secondary | ICD-10-CM | POA: Diagnosis not present

## 2023-01-23 DIAGNOSIS — Z6841 Body Mass Index (BMI) 40.0 and over, adult: Secondary | ICD-10-CM | POA: Diagnosis not present

## 2023-01-23 DIAGNOSIS — Z23 Encounter for immunization: Secondary | ICD-10-CM | POA: Diagnosis not present

## 2023-01-23 DIAGNOSIS — E782 Mixed hyperlipidemia: Secondary | ICD-10-CM | POA: Diagnosis not present

## 2023-01-23 DIAGNOSIS — Z125 Encounter for screening for malignant neoplasm of prostate: Secondary | ICD-10-CM | POA: Diagnosis not present

## 2023-01-23 DIAGNOSIS — Z1211 Encounter for screening for malignant neoplasm of colon: Secondary | ICD-10-CM | POA: Diagnosis not present

## 2023-01-23 DIAGNOSIS — D75839 Thrombocytosis, unspecified: Secondary | ICD-10-CM | POA: Diagnosis not present

## 2023-01-23 DIAGNOSIS — Z0000 Encounter for general adult medical examination without abnormal findings: Secondary | ICD-10-CM | POA: Diagnosis not present

## 2023-02-06 DIAGNOSIS — Z1211 Encounter for screening for malignant neoplasm of colon: Secondary | ICD-10-CM | POA: Diagnosis not present

## 2023-02-15 NOTE — Progress Notes (Signed)
 Cardiology Office Note:  .   Date:  02/16/2023  ID:  Michael Harrison, DOB Jun 03, 1970, MRN [id number] PCP: Cyrena Gwenn SQUIBB, MD (Inactive)  [facility] Providers Cardiologist:  None { History of Present Illness: .    Chief Complaint  Patient presents with   Coronary Artery Disease    Michael Harrison is a 53 y.o. male with history of CAD, HLD who presents for the evaluation of CAD at the request of Delayne Artist PARAS, MD.  History of Present Illness   Michael Harrison is a 52 year old male with coronary artery disease who presents for evaluation of hyperlipidemia.  He has a history of coronary artery disease with mild stenosis in the mid left anterior descending artery and a coronary calcium  score of 229, which is in the 96th percentile. He is statin intolerant due to joint pains and aches and is interested in alternative injectable medications for cholesterol management. His most recent low-density lipoprotein level is 189 mg/dL.  No symptoms of chest pain or trouble breathing. He denies any personal history of heart attack or stroke and does not have high blood pressure or diabetes.  He has a history of morbid obesity with a body mass index of 49 and has undergone a gastric sleeve procedure prior to 2022. He initially lost weight but has since regained approximately 40 pounds.  He has sleep apnea and uses a continuous positive airway pressure machine.  Family history is significant for heart disease.  Socially, he does not smoke and reports moderate alcohol consumption. He is married with two children aged 61 and 5.          Problem List 1. HLD -T chol 250, HDL 44, LDL 189, TG 93 -statin intolerant 2. CAD -CAC 229 (96th percentile) -mild CAD 25-49% mid LAD 3. Obesity -BMI 49 -gastric sleeve 4. OSA -CPAP     ROS: All other ROS reviewed and negative. Pertinent positives noted in the HPI.     Studies Reviewed: SABRA   EKG Interpretation Date/Time:  Thursday February 16 2023 10:11:26 EST Ventricular Rate:  74 PR Interval:  136 QRS Duration:  84 QT Interval:  396 QTC Calculation: 439 R Axis:   27  Text Interpretation: Normal sinus rhythm Normal ECG Confirmed by Barbaraann Kotyk [phone]) on 02/16/2023 10:12:17 AM    CCTA 04/20/2020    IMPRESSION: 1. Coronary calcium  score of 229. This was 96th percentile for age and sex matched controls.   2. Normal coronary origin with right dominance.   3. Mild CAD (25-49%) in the mid LAD.   4. Minimal CAD in the RCA (<25%).  Physical Exam:   VS:  BP 126/68 (BP Location: Right Arm, Patient Position: Sitting, Cuff Size: Large)   Pulse 74   Ht 5' 10 (1.778 m)   Wt (!) 348 lb (157.9 kg)   BMI 49.93 kg/m    Wt Readings from Last 3 Encounters:  02/16/23 (!) 348 lb (157.9 kg)  10/07/20 (!) 340 lb (154.2 kg)  09/30/20 (!) 346 lb 1.6 oz (157 kg)    GEN: Well nourished, well developed in no acute distress NECK: No JVD; No carotid bruits CARDIAC: RRR, no murmurs, rubs, gallops RESPIRATORY:  Clear to auscultation without rales, wheezing or rhonchi  ABDOMEN: Soft, non-tender, non-distended EXTREMITIES:  No edema; No deformity  ASSESSMENT AND PLAN: .   Assessment and Plan    Coronary Artery Disease (CAD) Non-obstructive CAD with elevated coronary calcium  score. No current symptoms  of chest pain or trouble breathing. Normal EKG. No prior echocardiogram. -Schedule 2D echocardiogram for baseline heart function assessment. -Start daily baby aspirin  (81mg ).  Hyperlipidemia LDL 189. Statin intolerance reported with joint pains and aches. Discussed PCSK9 inhibitor therapy as an alternative. -Refer to pharmacy lipid clinic for PCSK9 inhibitor therapy initiation and management.  Morbid Obesity BMI 49. History of gastric sleeve surgery with subsequent weight regain. -Encourage continued efforts for diet improvement and weight loss.  Obstructive Sleep Apnea On CPAP therapy. -Continue CPAP  use.  Follow-up -Schedule follow-up appointment in 1 year.              Follow-up: Return in about 1 year (around 02/16/2024).  Signed, Darryle DASEN. Barbaraann, MD, [facility]  [facility]  [address], [address] [place], [place] [address] [phone]  10:29 AM

## 2023-02-16 ENCOUNTER — Ambulatory Visit: Payer: 59 | Attending: Cardiovascular Disease | Admitting: Cardiovascular Disease

## 2023-02-16 ENCOUNTER — Encounter: Payer: Self-pay | Admitting: Cardiovascular Disease

## 2023-02-16 VITALS — BP 126/68 | HR 74 | Ht 70.0 in | Wt 348.0 lb

## 2023-02-16 DIAGNOSIS — Z789 Other specified health status: Secondary | ICD-10-CM

## 2023-02-16 DIAGNOSIS — E782 Mixed hyperlipidemia: Secondary | ICD-10-CM | POA: Diagnosis not present

## 2023-02-16 DIAGNOSIS — I2510 Atherosclerotic heart disease of native coronary artery without angina pectoris: Secondary | ICD-10-CM

## 2023-02-16 NOTE — Patient Instructions (Signed)
 Medication Instructions:  - START Aspirin  81mg  daily    *If you need a refill on your cardiac medications before your next appointment, please call your pharmacy*   Lab Work: None    If you have labs (blood work) drawn today and your tests are completely normal, you will receive your results only by: MyChart Message (if you have MyChart) OR A paper copy in the mail If you have any lab test that is abnormal or we need to change your treatment, we will call you to review the results.   Testing/Procedures: Echo will be scheduled at [address].  Your physician has requested that you have an echocardiogram. Echocardiography is a painless test that uses sound waves to create images of your heart. It provides your doctor with information about the size and shape of your heart and how well your heart's chambers and valves are working. This procedure takes approximately one hour. There are no restrictions for this procedure. Please do NOT wear cologne, perfume, aftershave, or lotions (deodorant is allowed). Please arrive 15 minutes prior to your appointment time.    Follow-Up: At [facility], you and your health needs are our priority.  As part of our continuing mission to provide you with exceptional heart care, we have created designated Provider Care Teams.  These Care Teams include your primary Cardiologist (physician) and Advanced Practice Providers (APPs -  Physician Assistants and Nurse Practitioners) who all work together to provide you with the care you need, when you need it.  We recommend signing up for the patient portal called MyChart.  Sign up information is provided on this After Visit Summary.  MyChart is used to connect with patients for Virtual Visits (Telemedicine).  Patients are able to view lab/test results, encounter notes, upcoming appointments, etc.  Non-urgent messages can be sent to your provider as well.   To learn more about what you can do with  MyChart, go to [email or web address].    Your next appointment:   1 year(s)  The format for your next appointment:   In Person  Provider:   Josefa Beauvais, FNP, Jon Hails, PA-C, Callie Goodrich, PA-C, Kathleen Johnson, PA-C, Delon Holts, PA-C, Lamarr Satterfield, DNP, ANP, Hao Meng, PA-C, Damien Braver, NP, or Katlyn West, NP    Other Instructions Dr. Barbaraann had referred you to our LIPID CLINIC to discuss a PCSK9I inhibitor medication for cholesterol management.

## 2023-03-08 ENCOUNTER — Encounter: Payer: Self-pay | Admitting: Cardiovascular Disease

## 2023-03-08 ENCOUNTER — Ambulatory Visit (HOSPITAL_COMMUNITY): Payer: 59 | Attending: Cardiovascular Disease

## 2023-03-08 DIAGNOSIS — I2510 Atherosclerotic heart disease of native coronary artery without angina pectoris: Secondary | ICD-10-CM | POA: Insufficient documentation

## 2023-03-08 LAB — ECHOCARDIOGRAM COMPLETE
Area-P 1/2: 3.17 cm2
S' Lateral: 3.5 cm

## 2023-04-04 ENCOUNTER — Telehealth: Payer: Self-pay | Admitting: Pharmacy Technician

## 2023-04-04 ENCOUNTER — Telehealth: Payer: Self-pay | Admitting: Pharmacist Clinician (PhC)/ Clinical Pharmacy Specialist

## 2023-04-04 ENCOUNTER — Encounter: Payer: Self-pay | Admitting: Pharmacist Clinician (PhC)/ Clinical Pharmacy Specialist

## 2023-04-04 ENCOUNTER — Ambulatory Visit: Payer: 59 | Attending: Cardiology | Admitting: Pharmacist Clinician (PhC)/ Clinical Pharmacy Specialist

## 2023-04-04 ENCOUNTER — Other Ambulatory Visit (HOSPITAL_COMMUNITY): Payer: Self-pay

## 2023-04-04 DIAGNOSIS — E782 Mixed hyperlipidemia: Secondary | ICD-10-CM

## 2023-04-04 NOTE — Patient Instructions (Signed)
 Your Results:             Your most recent labs Goal  Total Cholesterol 250 < 200  Triglycerides 93 < 150  HDL (happy/good cholesterol) 44 > 40  LDL (lousy/bad cholesterol 189 < 70   Medication changes:  We will start the process to get Repatha covered by your insurance.  Once the prior authorization is complete, I will call/send a MyChart message to let you know and confirm pharmacy information.   You will take one injection every 14 days  Lab orders:  We want to repeat labs after 2-3 months.  We will send you a lab order to remind you once we get closer to that time.    Patient Assistance:    Repatha Co-pay Card Instructions 1.      Go to [email or web address]  2.      Click the red "Repahta Co-Pay Card" button near the top right 3.      Scroll down and click "Yes, I have a Repatha prescription 4.      Continue to scroll down and selected "[employer]"  5.      Continue to scroll down and for the question "Are you eligible for Medicare but receiving prescription drug coverage from a former employer, union, or welfare plan?" select "No" 6.      Continue to scroll down and click the first box which is next to "Repatha Co-Pay Card, and beneath that, select "I want to enroll in the Repatha Co-Pay Card Program" 7.      Continue to scroll down until you see the "Next" button, then click it 8.      Fill out your personal information then click the "Next" button    Thank you for choosing [facility]

## 2023-04-04 NOTE — Telephone Encounter (Signed)
 Please do PA for Repatha - CAD

## 2023-04-04 NOTE — Telephone Encounter (Signed)
 Pharmacy Patient Advocate Encounter   Received notification from Pt Calls Messages that prior authorization for Repatha is required/requested.   Insurance verification completed.   The patient is insured through [employer] .   Per test claim: PA required; PA submitted to above mentioned insurance via CoverMyMeds Key/confirmation #/EOC [id number] Status is pending

## 2023-04-04 NOTE — Progress Notes (Addendum)
 Office Visit    Patient Name: Michael Harrison Date of Encounter: 04/04/2023  Primary Care Provider:  Ileana Ladd, MD (Inactive) Primary Cardiologist:  None  Chief Complaint    Hyperlipidemia   Significant Past Medical History   CAD CAC 229 (96th percentile)  OSA On CPAP  HTN Borderline, currently no meds  obesity Prior gastric bypass, has gained some back     No Known Allergies  History of Present Illness    Michael Harrison is a 53 y.o. male patient of Dr Flora Lipps, in the office today to discuss options for cholesterol management.  Insurance requesting SAMS-CI scoring for statin intolerance Regional distribution or pattern:  symmetric hip flexors or thigh aches -3 Temporal pattern:  symptoms onset > 12 weeks -1 Dechallenge:  improves upon withdrawal (2-4 weeks) - 1 Challenge:  same symptoms reoccur upon rechallenge < 4 weeks -3  Total score = 8 (Possible)  Insurance Carrier:  Community education officer  Pharmacy:   [facility]:  no - [employer] Copay card  LDL Cholesterol goal:  LDL < 70  Current Medications: none    Previously tried:  atorvastatin (tried multiple times), rosuvastatin - myalgias  Family Hx:  mother had MI, valve damamge; died from second MI year later; dad has high cholesterol; half-sister (dad), not known if any history;  kids 12 and 10, both healthy  Social Hx: Tobacco: no Alcohol: few times per week   Diet:   eats mix of home and out; out is more diners, no fast food; variety of proteins;  some vegetables, admits could eat more, usually frozen; doesn't snack much  Exercise:  stays active   Accessory Clinical Findings  In [facility] 01/23/2023:  TC 250, TG 93, HDL 44, LDL 189  Lab Results  Component Value Date   CHOL 129 03/06/2013   HDL 34 (L) 03/06/2013   LDLCALC 69 03/06/2013   TRIG 128 03/06/2013   CHOLHDL 3.8 03/06/2013    No results found for: "LIPOA"  Lab Results  Component Value Date   ALT 19 09/30/2020   AST 19 09/30/2020   ALKPHOS  73 09/30/2020   BILITOT 0.6 09/30/2020   Lab Results  Component Value Date   CREATININE 0.87 09/30/2020   BUN 9 09/30/2020   NA 138 09/30/2020   K 4.1 09/30/2020   CL 104 09/30/2020   CO2 26 09/30/2020   Lab Results  Component Value Date   HGBA1C 5.5 09/30/2020    Home Medications    Current Outpatient Medications  Medication Sig Dispense Refill   aspirin EC 81 MG tablet Take 81 mg by mouth daily. Swallow whole.     Cyanocobalamin (VITAMIN B-12 PO) Take 1 tablet by mouth daily.     IRON, FERROUS SULFATE, PO Take 1 tablet by mouth daily.     No current facility-administered medications for this visit.     Assessment & Plan    HLD (hyperlipidemia) Assessment: Patient with ASCVD/familial hyperlipidemia not at LDL goal of < 70 Most recent LDL 189 on 01/23/23 Not able to tolerate statins secondary to myalgias Reviewed options for lowering LDL cholesterol, including ezetimibe, PCSK-9 inhibitors, bempedoic acid and inclisiran.  Discussed mechanisms of action, dosing, side effects, potential decreases in LDL cholesterol and costs.  Also reviewed potential options for patient assistance.  Plan: Patient agreeable to starting Repatha 140 mg q14d Repeat labs after:  3 months Lipid Liver function Patient was given information on copay card from [employer],  PharmD CPP [facility] [address]  [place], [place] [address] [phone]  04/04/2023, 2:47 PM

## 2023-04-04 NOTE — Assessment & Plan Note (Signed)
 Assessment: Patient with ASCVD/familial hyperlipidemia not at LDL goal of < 70 Most recent LDL 189 on 01/23/23 Not able to tolerate statins secondary to myalgias Reviewed options for lowering LDL cholesterol, including ezetimibe, PCSK-9 inhibitors, bempedoic acid and inclisiran.  Discussed mechanisms of action, dosing, side effects, potential decreases in LDL cholesterol and costs.  Also reviewed potential options for patient assistance.  Plan: Patient agreeable to starting Repatha 140 mg q14d Repeat labs after:  3 months Lipid Liver function Patient was given information on copay card from [employer]

## 2023-04-07 ENCOUNTER — Other Ambulatory Visit (HOSPITAL_COMMUNITY): Payer: Self-pay

## 2023-04-10 NOTE — Telephone Encounter (Signed)
 Sent over extra documentation as requested

## 2023-04-13 ENCOUNTER — Other Ambulatory Visit (HOSPITAL_COMMUNITY): Payer: Self-pay

## 2023-04-18 ENCOUNTER — Encounter: Payer: Self-pay | Admitting: Pharmacist Clinician (PhC)/ Clinical Pharmacy Specialist

## 2023-04-19 ENCOUNTER — Other Ambulatory Visit (HOSPITAL_COMMUNITY): Payer: Self-pay

## 2023-04-19 NOTE — Telephone Encounter (Signed)
 Pending 04/19/23

## 2023-04-26 ENCOUNTER — Other Ambulatory Visit (HOSPITAL_COMMUNITY): Payer: Self-pay

## 2023-04-27 ENCOUNTER — Other Ambulatory Visit (HOSPITAL_COMMUNITY): Payer: Self-pay

## 2023-05-01 ENCOUNTER — Other Ambulatory Visit (HOSPITAL_COMMUNITY): Payer: Self-pay

## 2023-05-03 ENCOUNTER — Other Ambulatory Visit (HOSPITAL_COMMUNITY): Payer: Self-pay

## 2023-05-03 NOTE — Telephone Encounter (Signed)
 I called [phone] [employer] ph they said this was denied due to not proving the patient has ASCVD and his lipids has not been over 190. Sent Kristin a message

## 2023-05-04 NOTE — Telephone Encounter (Signed)
 denied due to not proving the patient has ASCVD and his lipids has not been over 190.  Attached denial in media

## 2023-05-09 ENCOUNTER — Other Ambulatory Visit (HOSPITAL_COMMUNITY): Payer: Self-pay

## 2023-05-09 MED ORDER — REPATHA SURECLICK 140 MG/ML ~~LOC~~ SOAJ: 140.0000 mg | SUBCUTANEOUS | 3 refills | Status: AC

## 2023-05-09 NOTE — Telephone Encounter (Signed)
Now approved.

## 2023-06-20 DIAGNOSIS — Z1211 Encounter for screening for malignant neoplasm of colon: Secondary | ICD-10-CM | POA: Diagnosis not present

## 2023-06-26 ENCOUNTER — Telehealth: Payer: Self-pay | Admitting: Internal Medicine

## 2023-06-26 NOTE — Telephone Encounter (Signed)
 Good afternoon Dr. Rosaline Coma  The following patient is calling to have a colonoscopy performed after a positive cologard. The patient was referred by his pcp to go to [place] not knowing the patient wanted to come here. The cologard was done 01/27/2023 but Cherene Core suggested he come here for his procedure. Please review and advise of scheduling. Thank you.

## 2023-07-18 DIAGNOSIS — K648 Other hemorrhoids: Secondary | ICD-10-CM | POA: Diagnosis not present

## 2023-07-18 DIAGNOSIS — D127 Benign neoplasm of rectosigmoid junction: Secondary | ICD-10-CM | POA: Diagnosis not present

## 2023-07-18 DIAGNOSIS — D120 Benign neoplasm of cecum: Secondary | ICD-10-CM | POA: Diagnosis not present

## 2023-07-18 DIAGNOSIS — D124 Benign neoplasm of descending colon: Secondary | ICD-10-CM | POA: Diagnosis not present

## 2023-07-18 DIAGNOSIS — K5732 Diverticulitis of large intestine without perforation or abscess without bleeding: Secondary | ICD-10-CM | POA: Diagnosis not present

## 2023-07-18 DIAGNOSIS — Z1211 Encounter for screening for malignant neoplasm of colon: Secondary | ICD-10-CM | POA: Diagnosis not present

## 2023-07-18 DIAGNOSIS — R195 Other fecal abnormalities: Secondary | ICD-10-CM | POA: Diagnosis not present

## 2023-07-18 DIAGNOSIS — K5730 Diverticulosis of large intestine without perforation or abscess without bleeding: Secondary | ICD-10-CM | POA: Diagnosis not present

## 2023-08-01 DIAGNOSIS — E782 Mixed hyperlipidemia: Secondary | ICD-10-CM | POA: Diagnosis not present

## 2023-08-01 DIAGNOSIS — R5383 Other fatigue: Secondary | ICD-10-CM | POA: Diagnosis not present

## 2023-08-08 ENCOUNTER — Encounter: Payer: Self-pay | Admitting: Pharmacist Clinician (PhC)/ Clinical Pharmacy Specialist

## 2023-08-08 DIAGNOSIS — E782 Mixed hyperlipidemia: Secondary | ICD-10-CM

## 2023-08-14 DIAGNOSIS — R7989 Other specified abnormal findings of blood chemistry: Secondary | ICD-10-CM | POA: Diagnosis not present

## 2023-08-14 DIAGNOSIS — E291 Testicular hypofunction: Secondary | ICD-10-CM | POA: Diagnosis not present

## 2023-08-18 DIAGNOSIS — L739 Follicular disorder, unspecified: Secondary | ICD-10-CM | POA: Diagnosis not present

## 2023-08-18 DIAGNOSIS — J0100 Acute maxillary sinusitis, unspecified: Secondary | ICD-10-CM | POA: Diagnosis not present

## 2023-10-25 DIAGNOSIS — M5432 Sciatica, left side: Secondary | ICD-10-CM | POA: Diagnosis not present

## 2023-10-25 DIAGNOSIS — M9903 Segmental and somatic dysfunction of lumbar region: Secondary | ICD-10-CM | POA: Diagnosis not present

## 2023-10-25 DIAGNOSIS — M9904 Segmental and somatic dysfunction of sacral region: Secondary | ICD-10-CM | POA: Diagnosis not present

## 2023-10-25 DIAGNOSIS — M9905 Segmental and somatic dysfunction of pelvic region: Secondary | ICD-10-CM | POA: Diagnosis not present

## 2023-10-30 DIAGNOSIS — M9904 Segmental and somatic dysfunction of sacral region: Secondary | ICD-10-CM | POA: Diagnosis not present

## 2023-10-30 DIAGNOSIS — M9903 Segmental and somatic dysfunction of lumbar region: Secondary | ICD-10-CM | POA: Diagnosis not present

## 2023-10-30 DIAGNOSIS — M9905 Segmental and somatic dysfunction of pelvic region: Secondary | ICD-10-CM | POA: Diagnosis not present

## 2023-10-30 DIAGNOSIS — M5432 Sciatica, left side: Secondary | ICD-10-CM | POA: Diagnosis not present

## 2023-10-31 DIAGNOSIS — M62830 Muscle spasm of back: Secondary | ICD-10-CM | POA: Diagnosis not present

## 2023-11-02 DIAGNOSIS — M5432 Sciatica, left side: Secondary | ICD-10-CM | POA: Diagnosis not present

## 2023-11-02 DIAGNOSIS — M9905 Segmental and somatic dysfunction of pelvic region: Secondary | ICD-10-CM | POA: Diagnosis not present

## 2023-11-02 DIAGNOSIS — M9904 Segmental and somatic dysfunction of sacral region: Secondary | ICD-10-CM | POA: Diagnosis not present

## 2023-11-02 DIAGNOSIS — M9903 Segmental and somatic dysfunction of lumbar region: Secondary | ICD-10-CM | POA: Diagnosis not present

## 2023-11-08 DIAGNOSIS — M9903 Segmental and somatic dysfunction of lumbar region: Secondary | ICD-10-CM | POA: Diagnosis not present

## 2023-11-08 DIAGNOSIS — M5432 Sciatica, left side: Secondary | ICD-10-CM | POA: Diagnosis not present

## 2023-11-08 DIAGNOSIS — M9904 Segmental and somatic dysfunction of sacral region: Secondary | ICD-10-CM | POA: Diagnosis not present

## 2023-11-08 DIAGNOSIS — M9905 Segmental and somatic dysfunction of pelvic region: Secondary | ICD-10-CM | POA: Diagnosis not present

## 2023-11-09 DIAGNOSIS — M5432 Sciatica, left side: Secondary | ICD-10-CM | POA: Diagnosis not present

## 2023-11-09 DIAGNOSIS — M9905 Segmental and somatic dysfunction of pelvic region: Secondary | ICD-10-CM | POA: Diagnosis not present

## 2023-11-09 DIAGNOSIS — M9903 Segmental and somatic dysfunction of lumbar region: Secondary | ICD-10-CM | POA: Diagnosis not present

## 2023-11-09 DIAGNOSIS — M9904 Segmental and somatic dysfunction of sacral region: Secondary | ICD-10-CM | POA: Diagnosis not present

## 2023-11-15 DIAGNOSIS — M9905 Segmental and somatic dysfunction of pelvic region: Secondary | ICD-10-CM | POA: Diagnosis not present

## 2023-11-15 DIAGNOSIS — M9904 Segmental and somatic dysfunction of sacral region: Secondary | ICD-10-CM | POA: Diagnosis not present

## 2023-11-15 DIAGNOSIS — M5432 Sciatica, left side: Secondary | ICD-10-CM | POA: Diagnosis not present

## 2023-11-15 DIAGNOSIS — M9903 Segmental and somatic dysfunction of lumbar region: Secondary | ICD-10-CM | POA: Diagnosis not present

## 2023-11-16 DIAGNOSIS — M9904 Segmental and somatic dysfunction of sacral region: Secondary | ICD-10-CM | POA: Diagnosis not present

## 2023-11-16 DIAGNOSIS — M9903 Segmental and somatic dysfunction of lumbar region: Secondary | ICD-10-CM | POA: Diagnosis not present

## 2023-11-16 DIAGNOSIS — M5432 Sciatica, left side: Secondary | ICD-10-CM | POA: Diagnosis not present

## 2023-11-16 DIAGNOSIS — M9905 Segmental and somatic dysfunction of pelvic region: Secondary | ICD-10-CM | POA: Diagnosis not present

## 2023-11-22 DIAGNOSIS — M9904 Segmental and somatic dysfunction of sacral region: Secondary | ICD-10-CM | POA: Diagnosis not present

## 2023-11-22 DIAGNOSIS — M5432 Sciatica, left side: Secondary | ICD-10-CM | POA: Diagnosis not present

## 2023-11-22 DIAGNOSIS — M9905 Segmental and somatic dysfunction of pelvic region: Secondary | ICD-10-CM | POA: Diagnosis not present

## 2023-11-22 DIAGNOSIS — M9903 Segmental and somatic dysfunction of lumbar region: Secondary | ICD-10-CM | POA: Diagnosis not present

## 2023-11-23 DIAGNOSIS — M9905 Segmental and somatic dysfunction of pelvic region: Secondary | ICD-10-CM | POA: Diagnosis not present

## 2023-11-23 DIAGNOSIS — M5432 Sciatica, left side: Secondary | ICD-10-CM | POA: Diagnosis not present

## 2023-11-23 DIAGNOSIS — M9903 Segmental and somatic dysfunction of lumbar region: Secondary | ICD-10-CM | POA: Diagnosis not present

## 2023-11-23 DIAGNOSIS — M9904 Segmental and somatic dysfunction of sacral region: Secondary | ICD-10-CM | POA: Diagnosis not present

## 2023-11-28 DIAGNOSIS — M9904 Segmental and somatic dysfunction of sacral region: Secondary | ICD-10-CM | POA: Diagnosis not present

## 2023-11-28 DIAGNOSIS — M5432 Sciatica, left side: Secondary | ICD-10-CM | POA: Diagnosis not present

## 2023-11-28 DIAGNOSIS — M9903 Segmental and somatic dysfunction of lumbar region: Secondary | ICD-10-CM | POA: Diagnosis not present

## 2023-11-28 DIAGNOSIS — M9905 Segmental and somatic dysfunction of pelvic region: Secondary | ICD-10-CM | POA: Diagnosis not present

## 2023-12-01 DIAGNOSIS — J0100 Acute maxillary sinusitis, unspecified: Secondary | ICD-10-CM | POA: Diagnosis not present

## 2023-12-01 DIAGNOSIS — J3489 Other specified disorders of nose and nasal sinuses: Secondary | ICD-10-CM | POA: Diagnosis not present

## 2023-12-01 DIAGNOSIS — R051 Acute cough: Secondary | ICD-10-CM | POA: Diagnosis not present

## 2024-03-20 ENCOUNTER — Ambulatory Visit (HOSPITAL_BASED_OUTPATIENT_CLINIC_OR_DEPARTMENT_OTHER): Admitting: Cardiovascular Disease
